# Patient Record
Sex: Female | Born: 1990
Health system: Southern US, Community
[De-identification: ages and names within clinical notes are randomized; demographics above are authoritative.]

## PROBLEM LIST (undated history)

## (undated) DIAGNOSIS — F319 Bipolar disorder, unspecified: Secondary | ICD-10-CM

## (undated) HISTORY — PX: CARDIAC SURGERY: SHX584

---

## 2011-03-10 ENCOUNTER — Emergency Department (HOSPITAL_COMMUNITY): Payer: Self-pay

## 2011-03-10 ENCOUNTER — Emergency Department (HOSPITAL_COMMUNITY)
Admission: EM | Admit: 2011-03-10 | Discharge: 2011-03-10 | Disposition: A | Discharge status: Home / Self Care | Payer: Self-pay | Attending: Emergency Medicine | Admitting: Emergency Medicine

## 2011-03-10 DIAGNOSIS — M25559 Pain in unspecified hip: Secondary | ICD-10-CM | POA: Insufficient documentation

## 2011-03-10 DIAGNOSIS — R51 Headache: Secondary | ICD-10-CM | POA: Insufficient documentation

## 2011-03-10 DIAGNOSIS — M79609 Pain in unspecified limb: Secondary | ICD-10-CM | POA: Insufficient documentation

## 2011-03-10 DIAGNOSIS — R0789 Other chest pain: Secondary | ICD-10-CM | POA: Insufficient documentation

## 2011-03-10 DIAGNOSIS — F1010 Alcohol abuse, uncomplicated: Secondary | ICD-10-CM | POA: Insufficient documentation

## 2011-03-10 DIAGNOSIS — M25569 Pain in unspecified knee: Secondary | ICD-10-CM | POA: Insufficient documentation

## 2011-03-10 DIAGNOSIS — R1032 Left lower quadrant pain: Secondary | ICD-10-CM | POA: Insufficient documentation

## 2011-03-10 DIAGNOSIS — IMO0002 Reserved for concepts with insufficient information to code with codable children: Secondary | ICD-10-CM | POA: Insufficient documentation

## 2011-03-10 LAB — POCT I-STAT, CHEM 8
Calcium, Ion: 0.99 mmol/L — ABNORMAL LOW (ref 1.12–1.32)
Chloride: 105 mEq/L (ref 96–112)
Glucose, Bld: 86 mg/dL (ref 70–99)
HCT: 46 % (ref 36.0–46.0)
Hemoglobin: 15.6 g/dL — ABNORMAL HIGH (ref 12.0–15.0)
TCO2: 23 mmol/L (ref 0–100)

## 2011-03-10 LAB — DIFFERENTIAL
Basophils Relative: 1 % (ref 0–1)
Eosinophils Absolute: 0.1 10*3/uL (ref 0.0–0.7)
Eosinophils Relative: 1 % (ref 0–5)
Lymphs Abs: 2.1 10*3/uL (ref 0.7–4.0)
Neutrophils Relative %: 63 % (ref 43–77)

## 2011-03-10 LAB — CBC
MCV: 91.2 fL (ref 78.0–100.0)
Platelets: 352 10*3/uL (ref 150–400)
RDW: 14.7 % (ref 11.5–15.5)
WBC: 8 10*3/uL (ref 4.0–10.5)

## 2011-06-08 ENCOUNTER — Emergency Department (HOSPITAL_COMMUNITY)
Admission: EM | Admit: 2011-06-08 | Discharge: 2011-06-08 | Disposition: A | Discharge status: Home / Self Care | Payer: Self-pay | Attending: Emergency Medicine | Admitting: Emergency Medicine

## 2011-06-08 DIAGNOSIS — F10929 Alcohol use, unspecified with intoxication, unspecified: Secondary | ICD-10-CM

## 2011-06-08 DIAGNOSIS — X58XXXA Exposure to other specified factors, initial encounter: Secondary | ICD-10-CM | POA: Insufficient documentation

## 2011-06-08 DIAGNOSIS — IMO0002 Reserved for concepts with insufficient information to code with codable children: Secondary | ICD-10-CM | POA: Insufficient documentation

## 2011-06-08 DIAGNOSIS — F1010 Alcohol abuse, uncomplicated: Secondary | ICD-10-CM | POA: Insufficient documentation

## 2011-06-08 LAB — URINALYSIS, ROUTINE W REFLEX MICROSCOPIC
Bilirubin Urine: NEGATIVE
Ketones, ur: NEGATIVE mg/dL
Leukocytes, UA: NEGATIVE
Nitrite: NEGATIVE
Specific Gravity, Urine: 1.005 (ref 1.005–1.030)
Urobilinogen, UA: 0.2 mg/dL (ref 0.0–1.0)

## 2011-06-08 LAB — URINE MICROSCOPIC-ADD ON

## 2011-06-08 NOTE — ED Notes (Signed)
[employer] x 2 officers outside of pt.'s room.

## 2011-06-08 NOTE — ED Provider Notes (Signed)
Medical screening examination/treatment/procedure(s) were conducted as a shared visit with non-physician practitioner(s) and myself.  I personally evaluated the patient during the encounter  Cyndra Numbers, MD 06/08/11 2246
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
## 2011-06-08 NOTE — ED Notes (Signed)
Pt taken to bathroom w/EDT to provide urine specimen.  Pt has been changed into blue paper scrubs and security has been notified to come want pt and search belongings.  [employer] standing at pt's door.  Pt is cooperative at this time.

## 2011-06-08 NOTE — ED Notes (Signed)
Pt brought in by [employer], states picked her up d/t intoxication and was going to take her to her sisters house and she urinated on herself and states "I'm going to kill myself"

## 2012-01-01 ENCOUNTER — Emergency Department (HOSPITAL_COMMUNITY)
Admission: EM | Admit: 2012-01-01 | Discharge: 2012-01-02 | Disposition: A | Discharge status: Home / Self Care | Payer: Self-pay | Attending: Emergency Medicine | Admitting: Emergency Medicine

## 2012-01-01 DIAGNOSIS — F10929 Alcohol use, unspecified with intoxication, unspecified: Secondary | ICD-10-CM

## 2012-01-01 DIAGNOSIS — R404 Transient alteration of awareness: Secondary | ICD-10-CM | POA: Insufficient documentation

## 2012-01-01 DIAGNOSIS — IMO0002 Reserved for concepts with insufficient information to code with codable children: Secondary | ICD-10-CM | POA: Insufficient documentation

## 2012-01-01 DIAGNOSIS — X58XXXA Exposure to other specified factors, initial encounter: Secondary | ICD-10-CM | POA: Insufficient documentation

## 2012-01-01 DIAGNOSIS — F1010 Alcohol abuse, uncomplicated: Secondary | ICD-10-CM | POA: Insufficient documentation

## 2012-01-01 DIAGNOSIS — R279 Unspecified lack of coordination: Secondary | ICD-10-CM | POA: Insufficient documentation

## 2012-01-01 LAB — URINALYSIS, ROUTINE W REFLEX MICROSCOPIC
Bilirubin Urine: NEGATIVE
Ketones, ur: NEGATIVE mg/dL
Nitrite: NEGATIVE
Urobilinogen, UA: 1 mg/dL (ref 0.0–1.0)

## 2012-01-01 LAB — CBC
HCT: 36.7 % (ref 36.0–46.0)
Hemoglobin: 12.4 g/dL (ref 12.0–15.0)
MCH: 31.3 pg (ref 26.0–34.0)
MCV: 92.7 fL (ref 78.0–100.0)
RBC: 3.96 MIL/uL (ref 3.87–5.11)

## 2012-01-01 MED ORDER — TETANUS-DIPHTH-ACELL PERTUSSIS 5-2.5-18.5 LF-MCG/0.5 IM SUSP
0.5000 mL | Freq: Once | INTRAMUSCULAR | Status: AC
  Administered 2012-01-01: 0.5 mL via INTRAMUSCULAR
  Filled 2012-01-01: qty 0.5

## 2012-01-01 MED ORDER — SODIUM CHLORIDE 0.9 % IV SOLN
Freq: Once | INTRAVENOUS | Status: AC
Start: 2012-01-01 — End: 2012-01-01
  Administered 2012-01-01: via INTRAVENOUS

## 2012-01-01 NOTE — ED Notes (Signed)
[id number]<AV> Expected date:<BR> Expected time:<BR> Means of arrival:<BR> Comments:<BR> EMS/intoxicated/&quot;rolled out in traffic&quot;/laceration

## 2012-01-01 NOTE — ED Notes (Signed)
Brought in by EMS from an apartment compound.  Per EMS report, pt was slumped over while sitting on the steps outside the apartment---somebody saw her and got worried what was going on with the pt and so she called 911.  Presents to ED very drowsy but arouseable---states she has been drinking "forties".

## 2012-01-02 ENCOUNTER — Encounter (HOSPITAL_COMMUNITY): Payer: Self-pay | Admitting: Emergency Medicine

## 2012-01-02 LAB — BASIC METABOLIC PANEL
BUN: 8 mg/dL (ref 6–23)
Chloride: 99 mEq/L (ref 96–112)
Glucose, Bld: 90 mg/dL (ref 70–99)
Potassium: 3.4 mEq/L — ABNORMAL LOW (ref 3.5–5.1)

## 2012-01-02 MED ORDER — BACITRACIN ZINC 500 UNIT/GM EX OINT
TOPICAL_OINTMENT | CUTANEOUS | Status: AC
  Administered 2012-01-02
  Filled 2012-01-02: qty 0.9

## 2012-01-02 MED ORDER — SODIUM CHLORIDE 0.9 % IV BOLUS (SEPSIS)
1000.0000 mL | Freq: Once | INTRAVENOUS | Status: AC
  Administered 2012-01-02: 1000 mL via INTRAVENOUS

## 2012-01-02 NOTE — ED Provider Notes (Addendum)
History     CSN: [id number]  Arrival date & time 01/01/12  2254   First MD Initiated Contact with Patient 01/01/12 2304      Chief Complaint  Patient presents with  . Alcohol Intoxication    (Consider location/radiation/quality/duration/timing/severity/associated sxs/prior treatment) HPI 21 yo female presents to the ER via EMS with reported alcohol intoxication.  Pt appears to be heavily intoxicated, when woken reports drinking 2 40 oz beers.  Pt with abrasions to elbows.  She is unsure of last tetanus.  She denies any other injuries, no fall or head injury.  Pt denies having friends or family who could come get her. History reviewed. No pertinent past medical history.  History reviewed. No pertinent past surgical history. Pt reports "hole in heart" open heart surgery as child  History reviewed. No pertinent family history.  History  Substance Use Topics  . Smoking status: Not on file  . Smokeless tobacco: Not on file  . Alcohol Use: Yes    OB History    Grav Para Term Preterm Abortions TAB SAB Ect Mult Living                  Review of Systems  Unable to perform ROS: Other    Allergies  Review of patient's allergies indicates no known allergies.  Home Medications  No current outpatient prescriptions on file.  BP 108/61  Pulse 90  Temp 98.4 F (36.9 C) (Oral)  Resp 18  SpO2 95%  Physical Exam  Nursing note and vitals reviewed. Constitutional: She appears well-developed and well-nourished.       Somnolent but arousable  HENT:  Head: Normocephalic and atraumatic.  Nose: Nose normal.  Mouth/Throat: Oropharynx is clear and moist.  Eyes: Conjunctivae and EOM are normal. Pupils are equal, round, and reactive to light.       nystagmus  Neck: Normal range of motion. Neck supple. No JVD present. No tracheal deviation present. No thyromegaly present.  Cardiovascular: Normal rate, regular rhythm, normal heart sounds and intact distal pulses.  Exam reveals no  gallop and no friction rub.   No murmur heard.      Sternotomy scar noted  Pulmonary/Chest: Effort normal and breath sounds normal. No stridor. No respiratory distress. She has no wheezes. She has no rales. She exhibits no tenderness.  Abdominal: Soft. Bowel sounds are normal. She exhibits no distension and no mass. There is no tenderness. There is no rebound and no guarding.  Musculoskeletal: Normal range of motion. She exhibits no edema and no tenderness.       Abrasions noted to bilateral elbows  Lymphadenopathy:    She has no cervical adenopathy.  Neurological: She is alert. She exhibits normal muscle tone. Coordination (mild ataxia) abnormal.       Somnolent but arousable  Skin: Skin is dry. No rash noted. No erythema. No pallor.  Psychiatric: She has a normal mood and affect. Her behavior is normal. Judgment and thought content normal.    ED Course  Procedures (including critical care time)  Labs Reviewed  BASIC METABOLIC PANEL - Abnormal; Notable for the following:    Potassium 3.4 (*)     All other components within normal limits  ETHANOL - Abnormal; Notable for the following:    Alcohol, Ethyl (B) 314 (*)     All other components within normal limits  CBC  URINALYSIS, ROUTINE W REFLEX MICROSCOPIC    Date: 01/24/2012  Rate: 97  Rhythm: normal sinus rhythm  QRS Axis:  normal  Intervals: normal  ST/T Wave abnormalities: nonspecific T wave changes  Conduction Disutrbances:none  Narrative Interpretation:   Old EKG Reviewed: none available     1. Alcohol intoxication       MDM  21 yo female with alcohol intoxication.  Will allow to sober, update tetanus        Olivia Mackie, MD 01/02/12 2137  Olivia Mackie, MD 01/24/12 [phone]

## 2012-01-02 NOTE — ED Notes (Signed)
Pt awake at this time and requested a soda drink--- states she still feels "drunk" and not ready to go home yet when informed her that it's time to go home.

## 2012-12-30 ENCOUNTER — Emergency Department (HOSPITAL_COMMUNITY): Payer: Self-pay

## 2012-12-30 ENCOUNTER — Encounter (HOSPITAL_COMMUNITY): Payer: Self-pay | Admitting: Emergency Medicine

## 2012-12-30 ENCOUNTER — Emergency Department (HOSPITAL_COMMUNITY)
Admission: EM | Admit: 2012-12-30 | Discharge: 2012-12-30 | Disposition: A | Discharge status: Home / Self Care | Payer: Self-pay | Attending: Emergency Medicine | Admitting: Emergency Medicine

## 2012-12-30 DIAGNOSIS — W19XXXA Unspecified fall, initial encounter: Secondary | ICD-10-CM

## 2012-12-30 DIAGNOSIS — Y9229 Other specified public building as the place of occurrence of the external cause: Secondary | ICD-10-CM | POA: Insufficient documentation

## 2012-12-30 DIAGNOSIS — F1010 Alcohol abuse, uncomplicated: Secondary | ICD-10-CM | POA: Insufficient documentation

## 2012-12-30 DIAGNOSIS — Z23 Encounter for immunization: Secondary | ICD-10-CM | POA: Insufficient documentation

## 2012-12-30 DIAGNOSIS — W010XXA Fall on same level from slipping, tripping and stumbling without subsequent striking against object, initial encounter: Secondary | ICD-10-CM | POA: Insufficient documentation

## 2012-12-30 DIAGNOSIS — IMO0002 Reserved for concepts with insufficient information to code with codable children: Secondary | ICD-10-CM | POA: Insufficient documentation

## 2012-12-30 DIAGNOSIS — S01511A Laceration without foreign body of lip, initial encounter: Secondary | ICD-10-CM

## 2012-12-30 DIAGNOSIS — Y9302 Activity, running: Secondary | ICD-10-CM | POA: Insufficient documentation

## 2012-12-30 DIAGNOSIS — S01501A Unspecified open wound of lip, initial encounter: Secondary | ICD-10-CM | POA: Insufficient documentation

## 2012-12-30 DIAGNOSIS — S8990XA Unspecified injury of unspecified lower leg, initial encounter: Secondary | ICD-10-CM | POA: Insufficient documentation

## 2012-12-30 MED ORDER — TETANUS-DIPHTH-ACELL PERTUSSIS 5-2.5-18.5 LF-MCG/0.5 IM SUSP
0.5000 mL | Freq: Once | INTRAMUSCULAR | Status: AC
  Administered 2012-12-30: 0.5 mL via INTRAMUSCULAR
  Filled 2012-12-30: qty 0.5

## 2012-12-30 MED ORDER — IBUPROFEN 800 MG PO TABS
800.0000 mg | ORAL_TABLET | Freq: Once | ORAL | Status: AC
  Administered 2012-12-30: 800 mg via ORAL
  Filled 2012-12-30 (×2): qty 1
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
## 2012-12-30 NOTE — ED Notes (Signed)
Pt showing  increased agitation. Pt stumbling in room,escorted to bathroom x 2. Security called to assist with load, uncooperative behavior. PA reviewed stated. Triage level changed due to increased head pain and agitation

## 2012-12-30 NOTE — ED Notes (Signed)
Pt "stumbled and fell onto face" . Bleeding noted on upper lip. Controlled at present. Pt denies alcohol use
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
## 2012-12-30 NOTE — ED Notes (Signed)
[id number]<XB> Expected date:<BR> Expected time:<BR> Means of arrival:<BR> Comments:<BR> 21 y/o lip lac

## 2012-12-30 NOTE — ED Notes (Signed)
Pt has been screaming in room. rn went in to ask pt what was wrong. Pt would not stop screaming and crying. PA came into room to talk to pt and pt would not talk to PA.

## 2012-12-30 NOTE — ED Notes (Signed)
Pt to xray at this time.

## 2012-12-30 NOTE — ED Notes (Signed)
Pt returned from xray at this time.

## 2013-01-01 NOTE — ED Provider Notes (Signed)
  Medical screening examination/treatment/procedure(s) were performed by non-physician practitioner and as supervising physician I was immediately available for consultation/collaboration.   Gerhard Munch, MD 01/01/13 [phone]

## 2013-02-11 ENCOUNTER — Encounter (HOSPITAL_COMMUNITY): Payer: Self-pay | Admitting: Physical Medicine and Rehabilitation

## 2013-02-11 ENCOUNTER — Emergency Department (HOSPITAL_COMMUNITY)
Admission: EM | Admit: 2013-02-11 | Discharge: 2013-02-11 | Disposition: A | Discharge status: Home / Self Care | Payer: Self-pay | Attending: Emergency Medicine | Admitting: Emergency Medicine

## 2013-02-11 ENCOUNTER — Encounter (HOSPITAL_COMMUNITY): Payer: Self-pay | Admitting: Emergency Medicine

## 2013-02-11 DIAGNOSIS — H10213 Acute toxic conjunctivitis, bilateral: Secondary | ICD-10-CM

## 2013-02-11 DIAGNOSIS — H5789 Other specified disorders of eye and adnexa: Secondary | ICD-10-CM

## 2013-02-11 DIAGNOSIS — F17200 Nicotine dependence, unspecified, uncomplicated: Secondary | ICD-10-CM | POA: Insufficient documentation

## 2013-02-11 DIAGNOSIS — H579 Unspecified disorder of eye and adnexa: Secondary | ICD-10-CM | POA: Insufficient documentation

## 2013-02-11 DIAGNOSIS — Z880 Allergy status to penicillin: Secondary | ICD-10-CM | POA: Insufficient documentation

## 2013-02-11 DIAGNOSIS — H10219 Acute toxic conjunctivitis, unspecified eye: Secondary | ICD-10-CM | POA: Insufficient documentation

## 2013-02-11 MED ORDER — IBUPROFEN 800 MG PO TABS
800.0000 mg | ORAL_TABLET | Freq: Once | ORAL | Status: AC
  Administered 2013-02-11: 800 mg via ORAL
  Filled 2013-02-11: qty 1

## 2013-02-11 NOTE — ED Notes (Signed)
ETOH onboard, patient reports drinking 4 cans of beer today.
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
## 2013-02-15 NOTE — ED Provider Notes (Signed)
Medical screening examination/treatment/procedure(s) were performed by non-physician practitioner and as supervising physician I was immediately available for consultation/collaboration.   Cordelro Gautreau M Chianne Byrns, DO 02/15/13 1158 

## 2014-07-28 ENCOUNTER — Encounter (HOSPITAL_COMMUNITY): Payer: Self-pay | Admitting: Emergency Medicine

## 2014-07-28 ENCOUNTER — Emergency Department (HOSPITAL_COMMUNITY)
Admission: EM | Admit: 2014-07-28 | Discharge: 2014-07-28 | Disposition: A | Discharge status: Home / Self Care | Payer: Self-pay | Attending: Emergency Medicine | Admitting: Emergency Medicine

## 2014-07-28 ENCOUNTER — Emergency Department (HOSPITAL_COMMUNITY): Payer: Self-pay

## 2014-07-28 DIAGNOSIS — F1010 Alcohol abuse, uncomplicated: Secondary | ICD-10-CM | POA: Diagnosis present

## 2014-07-28 DIAGNOSIS — F1014 Alcohol abuse with alcohol-induced mood disorder: Secondary | ICD-10-CM | POA: Insufficient documentation

## 2014-07-28 DIAGNOSIS — Z720 Tobacco use: Secondary | ICD-10-CM | POA: Insufficient documentation

## 2014-07-28 DIAGNOSIS — R45851 Suicidal ideations: Secondary | ICD-10-CM

## 2014-07-28 DIAGNOSIS — Z880 Allergy status to penicillin: Secondary | ICD-10-CM | POA: Insufficient documentation

## 2014-07-28 DIAGNOSIS — Y9289 Other specified places as the place of occurrence of the external cause: Secondary | ICD-10-CM | POA: Insufficient documentation

## 2014-07-28 DIAGNOSIS — S022XXA Fracture of nasal bones, initial encounter for closed fracture: Secondary | ICD-10-CM

## 2014-07-28 DIAGNOSIS — Y9389 Activity, other specified: Secondary | ICD-10-CM | POA: Insufficient documentation

## 2014-07-28 DIAGNOSIS — Z3202 Encounter for pregnancy test, result negative: Secondary | ICD-10-CM | POA: Insufficient documentation

## 2014-07-28 DIAGNOSIS — F1994 Other psychoactive substance use, unspecified with psychoactive substance-induced mood disorder: Secondary | ICD-10-CM | POA: Diagnosis present

## 2014-07-28 DIAGNOSIS — Y998 Other external cause status: Secondary | ICD-10-CM | POA: Insufficient documentation

## 2014-07-28 LAB — COMPREHENSIVE METABOLIC PANEL
ALK PHOS: 99 U/L (ref 39–117)
ALT: 24 U/L (ref 0–35)
ANION GAP: 5 (ref 5–15)
AST: 38 U/L — ABNORMAL HIGH (ref 0–37)
Albumin: 3.9 g/dL (ref 3.5–5.2)
BILIRUBIN TOTAL: 0.3 mg/dL (ref 0.3–1.2)
BUN: 11 mg/dL (ref 6–23)
CO2: 21 mmol/L (ref 19–32)
Calcium: 8 mg/dL — ABNORMAL LOW (ref 8.4–10.5)
Chloride: 113 mmol/L — ABNORMAL HIGH (ref 96–112)
Creatinine, Ser: 0.58 mg/dL (ref 0.50–1.10)
GLUCOSE: 95 mg/dL (ref 70–99)
POTASSIUM: 3.9 mmol/L (ref 3.5–5.1)
SODIUM: 139 mmol/L (ref 135–145)
Total Protein: 7.2 g/dL (ref 6.0–8.3)

## 2014-07-28 LAB — RAPID URINE DRUG SCREEN, HOSP PERFORMED
Amphetamines: NOT DETECTED
BARBITURATES: NOT DETECTED
Benzodiazepines: NOT DETECTED
Cocaine: NOT DETECTED
Opiates: NOT DETECTED
Tetrahydrocannabinol: NOT DETECTED

## 2014-07-28 LAB — CBC
HCT: 33.7 % — ABNORMAL LOW (ref 36.0–46.0)
HEMOGLOBIN: 10.8 g/dL — AB (ref 12.0–15.0)
MCH: 29.8 pg (ref 26.0–34.0)
MCHC: 32 g/dL (ref 30.0–36.0)
MCV: 92.8 fL (ref 78.0–100.0)
Platelets: 356 10*3/uL (ref 150–400)
RBC: 3.63 MIL/uL — AB (ref 3.87–5.11)
RDW: 13.8 % (ref 11.5–15.5)
WBC: 5.5 10*3/uL (ref 4.0–10.5)

## 2014-07-28 LAB — ETHANOL: ALCOHOL ETHYL (B): 239 mg/dL — AB (ref 0–9)

## 2014-07-28 LAB — ACETAMINOPHEN LEVEL: Acetaminophen (Tylenol), Serum: <10 ug/mL — ABNORMAL LOW (ref 10–30)

## 2014-07-28 LAB — SALICYLATE LEVEL

## 2014-07-28 LAB — POC URINE PREG, ED: Preg Test, Ur: NEGATIVE

## 2014-07-28 MED ORDER — ALUM & MAG HYDROXIDE-SIMETH 200-200-20 MG/5ML PO SUSP: 30.0000 mL | ORAL | Status: DC | PRN

## 2014-07-28 MED ORDER — LORAZEPAM 1 MG PO TABS
0.0000 mg | ORAL_TABLET | Freq: Four times a day (QID) | ORAL | Status: DC
  Administered 2014-07-28: 1 mg via ORAL
  Filled 2014-07-28: qty 2

## 2014-07-28 MED ORDER — HYDROCODONE-ACETAMINOPHEN 5-325 MG PO TABS
1.0000 | ORAL_TABLET | ORAL | Status: DC | PRN
  Administered 2014-07-28 (×2): 1 via ORAL
  Filled 2014-07-28 (×2): qty 1

## 2014-07-28 MED ORDER — LORAZEPAM 1 MG PO TABS: 0.0000 mg | ORAL_TABLET | Freq: Two times a day (BID) | ORAL | Status: DC

## 2014-07-28 MED ORDER — NICOTINE 21 MG/24HR TD PT24
21.0000 mg | MEDICATED_PATCH | Freq: Every day | TRANSDERMAL | Status: DC
  Administered 2014-07-28: 21 mg via TRANSDERMAL

## 2014-07-28 MED ORDER — IBUPROFEN 200 MG PO TABS
600.0000 mg | ORAL_TABLET | Freq: Three times a day (TID) | ORAL | Status: DC | PRN
  Administered 2014-07-28: 600 mg via ORAL
  Filled 2014-07-28: qty 3

## 2014-07-28 MED ORDER — ZOLPIDEM TARTRATE 5 MG PO TABS: 5.0000 mg | ORAL_TABLET | Freq: Every evening | ORAL | Status: DC | PRN

## 2014-07-28 MED ORDER — SALINE SPRAY 0.65 % NA SOLN
1.0000 | Freq: Once | NASAL | Status: AC
  Administered 2014-07-28: 1 via NASAL
  Filled 2014-07-28: qty 44

## 2014-07-28 MED ORDER — ONDANSETRON HCL 4 MG PO TABS
4.0000 mg | ORAL_TABLET | Freq: Three times a day (TID) | ORAL | Status: DC | PRN
  Administered 2014-07-28: 4 mg via ORAL
  Filled 2014-07-28: qty 1

## 2014-07-28 MED ORDER — VITAMIN B-1 100 MG PO TABS
100.0000 mg | ORAL_TABLET | Freq: Every day | ORAL | Status: DC
  Administered 2014-07-28: 100 mg via ORAL
  Filled 2014-07-28: qty 1

## 2014-07-28 MED ORDER — THIAMINE HCL 100 MG/ML IJ SOLN: 100.0000 mg | Freq: Every day | INTRAMUSCULAR | Status: DC

## 2014-07-28 MED ORDER — SALINE SPRAY 0.65 % NA SOLN
1.0000 | NASAL | Status: DC | PRN
  Filled 2014-07-28: qty 44
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
## 2014-07-28 NOTE — ED Notes (Signed)
TTS in evaluating patient 
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
## 2014-07-28 NOTE — BHH Suicide Risk Assessment (Signed)
Suicide Risk Assessment  Discharge Assessment   [facility]   Demographic Factors:  Adolescent or young adult and Low socioeconomic status  Total Time spent with patient: 45 minutes  Musculoskeletal: Strength & Muscle Tone: within normal limits Gait & Station: normal Patient leans: N/A  Psychiatric Specialty Exam:     Blood pressure 100/66, pulse 100, temperature 98.2 F (36.8 C), temperature source Oral, resp. rate 16, SpO2 99 %.There is no height or weight on file to calculate BMI.  General Appearance: Casual  Eye Contact::  Fair  Speech:  Normal Rate409  Volume:  Normal  Mood:  Irritable  Affect:  Congruent  Thought Process:  Coherent  Orientation:  Full (Time, Place, and Person)  Thought Content:  WDL  Suicidal Thoughts:  No  Homicidal Thoughts:  No  Memory:  Immediate;   Good Recent;   Good Remote;   Good  Judgement:  Fair  Insight:  Fair  Psychomotor Activity:  Normal  Concentration:  Good  Recall:  Good  Fund of Knowledge:Good  Language: Good  Akathisia:  No  Handed:  Right  AIMS (if indicated):     Assets:  Leisure Time Physical Health Resilience  Sleep:     Cognition: WNL  ADL's:  Intact      Has this patient used any form of tobacco in the last 30 days? (Cigarettes, Smokeless Tobacco, Cigars, and/or Pipes) Yes, A prescription for an FDA-approved tobacco cessation medication was offered at discharge and the patient refused  Mental Status Per Nursing Assessment::   On Admission:   Alcohol intoxication, suicidal ideations  Current Mental Status by Physician: NA  Loss Factors: NA  Historical Factors: NA  Risk Reduction Factors:   Sense of responsibility to family  Continued Clinical Symptoms:  Irritability  Cognitive Features That Contribute To Risk:  None    Suicide Risk:  Minimal: No identifiable suicidal ideation.  Patients presenting with no risk factors but with morbid ruminations; may be classified as  minimal risk based on the severity of the depressive symptoms  Principal Problem: Suicidal ideation Discharge Diagnoses:  Patient Active Problem List   Diagnosis Date Noted  . Alcohol abuse [F10.10] 07/28/2014    Priority: High  . Substance induced mood disorder [F19.94] 07/28/2014    Priority: High  . Suicidal ideation [R45.851] 07/28/2014    Priority: High      Plan Of Care/Follow-up recommendations:  Activity:  as tolerated Diet:  heart healthy diet  Is patient on multiple antipsychotic therapies at discharge:  No   Has Patient had three or more failed trials of antipsychotic monotherapy by history:  No  Recommended Plan for Multiple Antipsychotic Therapies: NA    LORD, JAMISON, PMH-NP 07/28/2014, 2:39 PM

## 2014-07-28 NOTE — BH Assessment (Signed)
BHH Assessment Progress Note  This writer was asked to discuss discharge options with this pt.  She reportedly is seeking a halfway house.  She is homeless and in need of a supportive environment in order to maintain sobriety.  She would [place] the [facility] area.  Kari Luna, TS reports that she has called [name], and that they have beds available, but that prospective residents need photo ID, a social security number, and proof that they are homeless.  When I discuss this with the pt she reports that she does not have a photo ID.  She then reports problems that she had not previously discussed with Thedore MinsMojeed Akintayo, MD and Nanine MeansJamison Lord, NP.  I reported these to [place].  Pt is still to be discharged with referrals to resources in the community.  Included in her discharge instructions is information about the [facility] homeless shelter, the [facility] for supportive services for the homeless, and [facility] for outpatient substance abuse treatment.  She has also been given printed information for [facility] [place] to follow up at her own initiative.  Kari Canninghomas Jeraldine Primeau, MA Triage Specialist 07/28/2014 @ 15:20
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
## 2014-07-28 NOTE — Discharge Instructions (Signed)
For your shelter needs, contact the [facility] shelter, operated by [facility]:       [facility] ([facility] by [facility])      [address]      [place], [place] [address]      [phone]  For a variety of supportive services for the homeless, contact the [facility]):       [facility]      [address]      [place], [place] [address]      [phone]  To help you maintain a sober lifestyle, a substance abuse treatment program may be beneficial to you.  Contact [facility] ([facility]) at your earliest opportunity to enroll in their program:       [facility] ([facility]      [address]      [place], [place] [address]      [phone]  You have also indicated that you are interested in information about halfway houses.  Refer to the printed information provided by Emergency Department staff about [facility] to find a halfway house that is suitable to your needs.
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
## 2014-07-30 ENCOUNTER — Emergency Department (HOSPITAL_COMMUNITY)
Admission: EM | Admit: 2014-07-30 | Discharge: 2014-07-30 | Discharge status: Against Medical Advice | Payer: Self-pay | Attending: Emergency Medicine | Admitting: Emergency Medicine

## 2014-07-30 DIAGNOSIS — F10129 Alcohol abuse with intoxication, unspecified: Secondary | ICD-10-CM | POA: Insufficient documentation

## 2014-07-30 DIAGNOSIS — Z720 Tobacco use: Secondary | ICD-10-CM | POA: Insufficient documentation

## 2014-07-30 NOTE — ED Notes (Signed)
Per EMS. [facility]. Pt was walking around upon EMS arrival. Pt had been drinking etoh. Pt also thinks she has an STD.

## 2015-05-18 ENCOUNTER — Encounter (HOSPITAL_COMMUNITY): Payer: Self-pay | Admitting: Emergency Medicine

## 2015-05-18 ENCOUNTER — Emergency Department (HOSPITAL_COMMUNITY)
Admission: EM | Admit: 2015-05-18 | Discharge: 2015-05-20 | Disposition: A | Discharge status: Home / Self Care | Payer: Self-pay | Attending: Emergency Medicine | Admitting: Emergency Medicine

## 2015-05-18 DIAGNOSIS — Z880 Allergy status to penicillin: Secondary | ICD-10-CM | POA: Insufficient documentation

## 2015-05-18 DIAGNOSIS — F17210 Nicotine dependence, cigarettes, uncomplicated: Secondary | ICD-10-CM | POA: Insufficient documentation

## 2015-05-18 DIAGNOSIS — F3130 Bipolar disorder, current episode depressed, mild or moderate severity, unspecified: Secondary | ICD-10-CM | POA: Insufficient documentation

## 2015-05-18 DIAGNOSIS — F1410 Cocaine abuse, uncomplicated: Secondary | ICD-10-CM | POA: Insufficient documentation

## 2015-05-18 DIAGNOSIS — Z3202 Encounter for pregnancy test, result negative: Secondary | ICD-10-CM | POA: Insufficient documentation

## 2015-05-18 HISTORY — DX: Bipolar disorder, unspecified: F31.9
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
## 2015-05-19 DIAGNOSIS — F1410 Cocaine abuse, uncomplicated: Secondary | ICD-10-CM | POA: Diagnosis present

## 2015-05-19 DIAGNOSIS — F1020 Alcohol dependence, uncomplicated: Secondary | ICD-10-CM | POA: Insufficient documentation

## 2015-05-19 LAB — RAPID URINE DRUG SCREEN, HOSP PERFORMED
Amphetamines: NOT DETECTED
Barbiturates: NOT DETECTED
Benzodiazepines: NOT DETECTED
Cocaine: POSITIVE — AB
Opiates: NOT DETECTED
TETRAHYDROCANNABINOL: NOT DETECTED

## 2015-05-19 LAB — ETHANOL: Alcohol, Ethyl (B): 88 mg/dL — ABNORMAL HIGH (ref <5)

## 2015-05-19 LAB — CBC WITH DIFFERENTIAL/PLATELET
BASOS ABS: 0 10*3/uL (ref 0.0–0.1)
Basophils Relative: 1 %
EOS PCT: 4 %
Eosinophils Absolute: 0.1 10*3/uL (ref 0.0–0.7)
HCT: 35.6 % — ABNORMAL LOW (ref 36.0–46.0)
Hemoglobin: 11.5 g/dL — ABNORMAL LOW (ref 12.0–15.0)
LYMPHS PCT: 31 %
Lymphs Abs: 1.1 10*3/uL (ref 0.7–4.0)
MCH: 31.6 pg (ref 26.0–34.0)
MCHC: 32.3 g/dL (ref 30.0–36.0)
MCV: 97.8 fL (ref 78.0–100.0)
Monocytes Absolute: 0.4 10*3/uL (ref 0.1–1.0)
Monocytes Relative: 11 %
NEUTROS PCT: 53 %
Neutro Abs: 1.9 10*3/uL (ref 1.7–7.7)
PLATELETS: 327 10*3/uL (ref 150–400)
RBC: 3.64 MIL/uL — AB (ref 3.87–5.11)
RDW: 15.5 % (ref 11.5–15.5)
WBC: 3.6 10*3/uL — AB (ref 4.0–10.5)

## 2015-05-19 LAB — COMPREHENSIVE METABOLIC PANEL
ALT: 105 U/L — AB (ref 14–54)
AST: 144 U/L — ABNORMAL HIGH (ref 15–41)
Albumin: 3.9 g/dL (ref 3.5–5.0)
Alkaline Phosphatase: 102 U/L (ref 38–126)
Anion gap: 13 (ref 5–15)
BUN: 9 mg/dL (ref 6–20)
CHLORIDE: 106 mmol/L (ref 101–111)
CO2: 24 mmol/L (ref 22–32)
CREATININE: 0.78 mg/dL (ref 0.44–1.00)
Calcium: 9 mg/dL (ref 8.9–10.3)
GFR calc Af Amer: >60 mL/min (ref >60)
Glucose, Bld: 115 mg/dL — ABNORMAL HIGH (ref 65–99)
Potassium: 4.4 mmol/L (ref 3.5–5.1)
SODIUM: 143 mmol/L (ref 135–145)
Total Bilirubin: 0.4 mg/dL (ref 0.3–1.2)
Total Protein: 7.1 g/dL (ref 6.5–8.1)

## 2015-05-19 LAB — PREGNANCY, URINE: PREG TEST UR: NEGATIVE

## 2015-05-19 MED ORDER — HYDROXYZINE HCL 25 MG PO TABS: 25.0000 mg | ORAL_TABLET | Freq: Three times a day (TID) | ORAL | Status: DC | PRN

## 2015-05-19 MED ORDER — ONDANSETRON HCL 4 MG PO TABS
4.0000 mg | ORAL_TABLET | Freq: Three times a day (TID) | ORAL | Status: DC | PRN
  Administered 2015-05-19: 4 mg via ORAL
  Filled 2015-05-19: qty 1

## 2015-05-19 MED ORDER — METRONIDAZOLE 500 MG PO TABS
2000.0000 mg | ORAL_TABLET | Freq: Once | ORAL | Status: AC
  Administered 2015-05-19: 2000 mg via ORAL

## 2015-05-19 MED ORDER — TRAZODONE HCL 50 MG PO TABS: 50.0000 mg | ORAL_TABLET | Freq: Every day | ORAL | Status: DC

## 2015-05-19 MED ORDER — ULIPRISTAL ACETATE 30 MG PO TABS
30.0000 mg | ORAL_TABLET | Freq: Once | ORAL | Status: AC
  Administered 2015-05-19: 30 mg via ORAL
  Filled 2015-05-19: qty 1

## 2015-05-19 MED ORDER — AZITHROMYCIN 1 G PO PACK
1.0000 g | PACK | Freq: Once | ORAL | Status: AC
  Administered 2015-05-19: 1 g via ORAL
  Filled 2015-05-19: qty 1

## 2015-05-19 MED ORDER — CEFIXIME 400 MG PO TABS
400.0000 mg | ORAL_TABLET | Freq: Once | ORAL | Status: AC
  Administered 2015-05-19: 400 mg via ORAL
  Filled 2015-05-19: qty 1

## 2015-05-19 NOTE — BH Assessment (Signed)
[facility] Assessment Progress Note  The following facilities have been contacted to seek placement for this pt, with results as noted:  Beds available, information sent, decision pending[facility]   No beds available, but accepting referrals for future consideration; information sent:  [facility]   At [facility]:  [facility]    Munira Polson, [place] Triage Specialist [phone]

## 2015-05-19 NOTE — ED Notes (Signed)
As per NP  [place], pt refusing admission, will be discharged in am.  Pt agreeable to disposition.

## 2015-05-19 NOTE — ED Notes (Signed)
[facility] refused [facility].
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
## 2015-05-19 NOTE — BH Assessment (Signed)
Contacted [facility] charge nurse for cart for tele-psych. Afterwards, consulted with Karleen HampshireSpencer, PA who states that does meet inpatient criteria 300 hall recommended, placement pending, elevated BAC levels. Lanard Arguijo K. Jesus GeneraHarris,  LPC-A, [facility]  Counselor 05/19/2015 4:47 AM

## 2015-05-19 NOTE — ED Notes (Signed)
Pt refused lab work - took awhile to wake up and seemed pretty out of it.  Did verbally say no when asked if we could do blood work.  RN aware.

## 2015-05-19 NOTE — ED Notes (Signed)
telepsych computer at bedside.  

## 2015-05-19 NOTE — ED Notes (Signed)
Pt states she does not want to be admitted, she wants to go home.  TTS [place] notified.

## 2015-05-19 NOTE — ED Notes (Signed)
[facility]  Was informed patient refused all blood work.  She also refused to see the SANE nurse.  Patient is not forthcoming with information at this time.  She appears tired and lethargic.  She states that she is having thoughts of hurting herself.  Safety checks continues q15 minutes.  Patient will be seen by TTS and treatment team this morning.

## 2015-05-19 NOTE — ED Notes (Signed)
Pt AAO x 3, no distress noted, resting at present.  Monitoring for safety, Q 15 min checks in effect.

## 2015-05-19 NOTE — ED Notes (Signed)
Patient came to nursing stations requesting discharge.  Explained to patient that MD and NP had discussed getting a bed at another facility.  Patient states, "I'm fine now.  I don't need that.  I just want to leave."  Informed NP, Julieanne CottonJosephine who will talk to patient.

## 2015-05-19 NOTE — ED Notes (Signed)
Pt has 2 belonging bags. In pt bags she has two black jackets, a pair of nike shoes, a black t-shirt, a pair of green socks and a pair of blue jeans. Pt was wand by security.
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
## 2015-05-19 NOTE — ED Notes (Signed)
PA Antony MaduraKelly Humes aware pt refused blood work.

## 2015-05-19 NOTE — BH Assessment (Signed)
[facility] Sts that patient was accepted to their facility for inpatient treatment. Nursing report # is [phone]. The accepting provider is Dr. Guss [place]. Patient is voluntary and [facility] will need to provide transport to the facility.

## 2015-05-19 NOTE — ED Notes (Signed)
Per [name] PA-C's note Pt refused SANE workup and refused the medications in the SANE KIT.  Pt is alert but slow to respond at this time.  Pt going to Psych ED and RN in Psych unit aware of this.
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
## 2015-05-20 DIAGNOSIS — F315 Bipolar disorder, current episode depressed, severe, with psychotic features: Secondary | ICD-10-CM
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
## 2015-05-20 NOTE — Progress Notes (Signed)
[facility] she was given uninsured resources  Pt did state she was active with [facility]  CM spoke with pt who [place]cp.  CM discussed and provided written information for uninsured accepting pcps, discussed the importance of pcp vs EDP services for f/u care, www.needymeds.org, www.goodrx.com, discounted pharmacies and other [place] resources such as [facility] , [facility], affordable care act, financial assistance, uninsured dental services, [place] med assist, DSS and  health department  Reviewed resources for [place] uninsured accepting pcps like [facility], family medicine at [address], [facility], [facility], local urgent care centers, [facility], [facility] family [facility], general medical clinics, [facility] services of the [place], [facility] urgent care plus others, medication resources, [facility] out patient pharmacies and housing Pt voiced understanding and appreciation of resources provided   Provided [facility] contact information

## 2015-05-20 NOTE — ED Notes (Signed)
Patient discharged home per MD order.  Patient's discharge instructions reviewed and follow up is with [facility].  Patient received all her personal belongings. She denies SI/HI/AVH. She plans to keep her follow up appointment and continue with medication management.

## 2015-05-20 NOTE — BH Assessment (Signed)
Patient was reassessed by TTS.   Patient states that she is "ready to go" and asked "where is the doctor, I need to leave I got stuff to do." Patient states that she is on probation and [facility] that is a condition of her probation and she missed her appointment due to being in the ED. Patient states that she has "no issues, I'm fine" and requested to see the psychiatrist.  Patient denies SI/HI and AVH and contracts for safety. Patient states that she would like to follow-up with [facility].   Informed psychiatrist of patients request.

## 2015-05-20 NOTE — Discharge Instructions (Signed)
For your ongoing mental health needs, you are advised to follow up with [facility].  If you do not currently have an appointment, new and returning patients are seen at their walk-in clinic.  Walk-in hours are Monday - Friday from 8:00 am - 3:00 pm.  Walk-in patients are seen on a first come, first served basis.  Try to arrive as early as possible for he best chance of being seen the same day: ° °     [facility] °     [address] °     [place], [place] [address] °     [phone] °

## 2015-05-20 NOTE — BH Assessment (Signed)
[facility] Assessment Progress Note   Per Thedore MinsMojeed Akintayo, MD, this pt does not require psychiatric hospitalization at this time.  [facility].  Discharge instructions include contact information for [facility].  Pt's nurse has been notified.    Doylene Canninghomas Milicent Acheampong, MA  Triage Specialist  [phone]

## 2015-05-20 NOTE — [facility] Suicide Risk Assessment (Cosign Needed)
    [facility]   Demographic Factors:  Adolescent or young adult and Low socioeconomic status  Total Time spent with patient: 15 minutes  Musculoskeletal: Strength & Muscle Tone: within normal limits Gait & Station: normal Patient leans: N/A  Psychiatric Specialty Exam:     Blood pressure 100/66, pulse 82, temperature 97.5 F (36.4 C), temperature source Oral, resp. rate 18, last menstrual period 05/11/2015, SpO2 100 %.There is no height or weight on file to calculate BMI.  General Appearance: Casual and Fairly Groomed  Patent attorneyye Contact:: Good  Speech: Clear and Coherent and Normal Rate  Volume: Normal  Mood: Anxious  Affect: Appropriate and Congruent  Thought Process: Coherent, Goal Directed and Intact  Orientation: Full (Time, Place, and Person)  Thought Content: WDL  Suicidal Thoughts: No  Homicidal Thoughts: No  Memory: Immediate; Good Recent; Good Remote; Good  Judgement: Fair  Insight: Fair  Psychomotor Activity: Psychomotor Retardation  Concentration: Good  Recall: NA  Fund of Knowledge:Fair  Language: Good  Akathisia: NA  Handed: Right  AIMS (if indicated):    Assets: Desire for Improvement Financial Resources/Insurance Housing  ADL's: Intact  Cognition: WNL  Sleep:               Has this patient used any form of tobacco in the last 30 days? (Cigarettes, Smokeless Tobacco, Cigars, and/or Pipes) No  Mental Status Per Nursing Assessment::   On Admission:     Current Mental Status by Physician: Pt seen and chart reviewed with NP/MD team on 05/20/2015. Pt seen and chart reviewed. Pt is alert/oriented x4, calm, cooperative, and appropriate to situation. Pt denies suicidal/homicidal ideation and psychosis and does not appear to be responding to internal stimuli. Pt reports that [facility] and that she feels she no longer needs to be here.   Loss  Factors: Financial problems/change in socioeconomic status  Historical Factors: Family history of mental illness or substance abuse and Impulsivity  Risk Reduction Factors:   Sense of responsibility to family, Positive social support and Positive therapeutic relationship  Continued Clinical Symptoms:  Depression:   Impulsivity Insomnia  Cognitive Features That Contribute To Risk:  Polarized thinking    Suicide Risk:  Minimal: No identifiable suicidal ideation.  Patients presenting with no risk factors but with morbid ruminations; may be classified as minimal risk based on the severity of the depressive symptoms  Principal Problem: Bipolar affective disorder, depressed, severe, with psychotic behavior [facility](HCC) Discharge Diagnoses:  Patient Active Problem List   Diagnosis Date Noted  . Bipolar affective disorder, depressed, severe, with psychotic behavior (HCC) [F31.5] 05/19/2015  . Cocaine abuse [F14.10] 05/19/2015  . Alcohol use disorder, moderate, dependence (HCC) [F10.20]   . Alcohol abuse [F10.10] 07/28/2014  . Substance induced mood disorder (HCC) [F19.94] 07/28/2014  . Suicidal ideation [R45.851] 07/28/2014      Plan Of Care/Follow-up recommendations:  Activity:  As tolerated Diet:  Heart healthy with low sodium.  Is patient on multiple antipsychotic therapies at discharge:  No   Has Patient had three or more failed trials of antipsychotic monotherapy by history:  No  Recommended Plan for Multiple Antipsychotic Therapies: NA   Jaime Dome C, FNP-BC 05/20/2015, 11:12 AM

## 2016-04-30 ENCOUNTER — Encounter (HOSPITAL_COMMUNITY): Payer: Self-pay | Admitting: Emergency Medicine

## 2016-04-30 ENCOUNTER — Emergency Department (HOSPITAL_COMMUNITY): Payer: Self-pay

## 2016-04-30 ENCOUNTER — Emergency Department (HOSPITAL_COMMUNITY)
Admission: EM | Admit: 2016-04-30 | Discharge: 2016-04-30 | Disposition: A | Discharge status: Home / Self Care | Payer: Self-pay | Attending: Emergency Medicine | Admitting: Emergency Medicine

## 2016-04-30 DIAGNOSIS — Z79899 Other long term (current) drug therapy: Secondary | ICD-10-CM | POA: Insufficient documentation

## 2016-04-30 DIAGNOSIS — F17210 Nicotine dependence, cigarettes, uncomplicated: Secondary | ICD-10-CM | POA: Insufficient documentation

## 2016-04-30 DIAGNOSIS — S0033XA Contusion of nose, initial encounter: Secondary | ICD-10-CM | POA: Insufficient documentation

## 2016-04-30 DIAGNOSIS — M25561 Pain in right knee: Secondary | ICD-10-CM | POA: Insufficient documentation

## 2016-04-30 DIAGNOSIS — R51 Headache: Secondary | ICD-10-CM | POA: Insufficient documentation

## 2016-04-30 DIAGNOSIS — Y939 Activity, unspecified: Secondary | ICD-10-CM | POA: Insufficient documentation

## 2016-04-30 DIAGNOSIS — Y999 Unspecified external cause status: Secondary | ICD-10-CM | POA: Insufficient documentation

## 2016-04-30 DIAGNOSIS — Y92524 Gas station as the place of occurrence of the external cause: Secondary | ICD-10-CM | POA: Insufficient documentation

## 2016-04-30 MED ORDER — OXYMETAZOLINE HCL 0.05 % NA SOLN
1.0000 | Freq: Once | NASAL | Status: DC
  Filled 2016-04-30: qty 15

## 2016-04-30 MED ORDER — NAPROXEN 500 MG PO TABS: 500.0000 mg | ORAL_TABLET | Freq: Two times a day (BID) | ORAL | 0 refills | Status: DC

## 2016-04-30 MED ORDER — TETANUS-DIPHTH-ACELL PERTUSSIS 5-2.5-18.5 LF-MCG/0.5 IM SUSP
0.5000 mL | Freq: Once | INTRAMUSCULAR | Status: AC
  Administered 2016-04-30: 0.5 mL via INTRAMUSCULAR
  Filled 2016-04-30: qty 0.5
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
## 2016-04-30 NOTE — Discharge Instructions (Signed)
Ice areas of pain to limit swelling. You may take naproxen as prescribed for pain control. Use Afrin provided to you if you develop any nosebleeds. For repeat nosebleeds, apply constant pressure for at least 5 minutes to stop bleeding. Follow-up with your primary care doctor for a recheck of symptoms in one week.

## 2016-04-30 NOTE — ED Triage Notes (Signed)
Pt now sitting up and [facility]. PA at bedside.

## 2016-04-30 NOTE — ED Notes (Addendum)
After receiving tetnus shot pt began to curse at staff and refused to blow nose in order to administer afrin nasal spray. Officer at bedside. Currently awaiting test results and disposition. Handcuffs still in place behind back.
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
## 2016-08-19 ENCOUNTER — Emergency Department (HOSPITAL_COMMUNITY): Payer: Self-pay

## 2016-08-19 ENCOUNTER — Encounter (HOSPITAL_COMMUNITY): Payer: Self-pay | Admitting: Emergency Medicine

## 2016-08-19 ENCOUNTER — Emergency Department (HOSPITAL_COMMUNITY)
Admission: EM | Admit: 2016-08-19 | Discharge: 2016-08-19 | Disposition: A | Discharge status: Home / Self Care | Payer: Self-pay | Attending: Emergency Medicine | Admitting: Emergency Medicine

## 2016-08-19 DIAGNOSIS — Y939 Activity, unspecified: Secondary | ICD-10-CM | POA: Insufficient documentation

## 2016-08-19 DIAGNOSIS — Y929 Unspecified place or not applicable: Secondary | ICD-10-CM | POA: Insufficient documentation

## 2016-08-19 DIAGNOSIS — Y999 Unspecified external cause status: Secondary | ICD-10-CM | POA: Insufficient documentation

## 2016-08-19 DIAGNOSIS — S0083XA Contusion of other part of head, initial encounter: Secondary | ICD-10-CM | POA: Insufficient documentation

## 2016-08-19 DIAGNOSIS — F17210 Nicotine dependence, cigarettes, uncomplicated: Secondary | ICD-10-CM | POA: Insufficient documentation

## 2016-08-19 DIAGNOSIS — Z79899 Other long term (current) drug therapy: Secondary | ICD-10-CM | POA: Insufficient documentation

## 2016-08-19 MED ORDER — IBUPROFEN 800 MG PO TABS
800.0000 mg | ORAL_TABLET | Freq: Once | ORAL | Status: AC
  Administered 2016-08-19: 800 mg via ORAL
  Filled 2016-08-19: qty 1

## 2016-08-19 MED ORDER — IBUPROFEN 800 MG PO TABS: 800.0000 mg | ORAL_TABLET | Freq: Three times a day (TID) | ORAL | 0 refills | Status: DC
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
## 2017-11-23 ENCOUNTER — Inpatient Hospital Stay (HOSPITAL_COMMUNITY)
Admission: AD | Admit: 2017-11-23 | Discharge: 2017-11-26 | DRG: 885 | Disposition: A | Discharge status: Home / Self Care | Payer: Federal, State, Local not specified - Other | Attending: Psychiatry | Admitting: Psychiatry

## 2017-11-23 ENCOUNTER — Other Ambulatory Visit: Payer: Self-pay

## 2017-11-23 ENCOUNTER — Encounter (HOSPITAL_COMMUNITY): Payer: Self-pay

## 2017-11-23 ENCOUNTER — Encounter (HOSPITAL_COMMUNITY): Payer: Self-pay | Admitting: Emergency Medicine

## 2017-11-23 ENCOUNTER — Emergency Department (HOSPITAL_COMMUNITY)
Admission: EM | Admit: 2017-11-23 | Discharge: 2017-11-23 | Disposition: A | Discharge status: Other Acute Inpatient Hospital | Payer: Self-pay | Attending: Emergency Medicine | Admitting: Emergency Medicine

## 2017-11-23 DIAGNOSIS — F315 Bipolar disorder, current episode depressed, severe, with psychotic features: Principal | ICD-10-CM | POA: Diagnosis present

## 2017-11-23 DIAGNOSIS — Z79899 Other long term (current) drug therapy: Secondary | ICD-10-CM

## 2017-11-23 DIAGNOSIS — F419 Anxiety disorder, unspecified: Secondary | ICD-10-CM | POA: Diagnosis present

## 2017-11-23 DIAGNOSIS — T43022A Poisoning by tetracyclic antidepressants, intentional self-harm, initial encounter: Secondary | ICD-10-CM | POA: Diagnosis present

## 2017-11-23 DIAGNOSIS — F314 Bipolar disorder, current episode depressed, severe, without psychotic features: Secondary | ICD-10-CM | POA: Diagnosis present

## 2017-11-23 DIAGNOSIS — R45851 Suicidal ideations: Secondary | ICD-10-CM | POA: Insufficient documentation

## 2017-11-23 DIAGNOSIS — F1010 Alcohol abuse, uncomplicated: Secondary | ICD-10-CM | POA: Diagnosis present

## 2017-11-23 DIAGNOSIS — T43021A Poisoning by tetracyclic antidepressants, accidental (unintentional), initial encounter: Secondary | ICD-10-CM | POA: Diagnosis present

## 2017-11-23 DIAGNOSIS — Z880 Allergy status to penicillin: Secondary | ICD-10-CM | POA: Diagnosis not present

## 2017-11-23 DIAGNOSIS — F332 Major depressive disorder, recurrent severe without psychotic features: Secondary | ICD-10-CM | POA: Diagnosis not present

## 2017-11-23 DIAGNOSIS — F1020 Alcohol dependence, uncomplicated: Secondary | ICD-10-CM | POA: Diagnosis present

## 2017-11-23 DIAGNOSIS — F10239 Alcohol dependence with withdrawal, unspecified: Secondary | ICD-10-CM | POA: Diagnosis present

## 2017-11-23 DIAGNOSIS — Y907 Blood alcohol level of 200-239 mg/100 ml: Secondary | ICD-10-CM | POA: Diagnosis present

## 2017-11-23 DIAGNOSIS — F17210 Nicotine dependence, cigarettes, uncomplicated: Secondary | ICD-10-CM | POA: Diagnosis present

## 2017-11-23 LAB — COMPREHENSIVE METABOLIC PANEL
ALK PHOS: 100 U/L (ref 38–126)
ALT: 18 U/L (ref 14–54)
AST: 27 U/L (ref 15–41)
Albumin: 4.5 g/dL (ref 3.5–5.0)
Anion gap: 8 (ref 5–15)
BILIRUBIN TOTAL: 0.6 mg/dL (ref 0.3–1.2)
BUN: 7 mg/dL (ref 6–20)
CO2: 27 mmol/L (ref 22–32)
CREATININE: 0.8 mg/dL (ref 0.44–1.00)
Calcium: 8.7 mg/dL — ABNORMAL LOW (ref 8.9–10.3)
Chloride: 106 mmol/L (ref 101–111)
GFR calc Af Amer: >60 mL/min (ref >60)
Glucose, Bld: 86 mg/dL (ref 65–99)
Potassium: 3.9 mmol/L (ref 3.5–5.1)
Sodium: 141 mmol/L (ref 135–145)
TOTAL PROTEIN: 8.2 g/dL — AB (ref 6.5–8.1)

## 2017-11-23 LAB — CBC
HEMATOCRIT: 39.1 % (ref 36.0–46.0)
HEMOGLOBIN: 12.9 g/dL (ref 12.0–15.0)
MCH: 29.5 pg (ref 26.0–34.0)
MCHC: 33 g/dL (ref 30.0–36.0)
MCV: 89.3 fL (ref 78.0–100.0)
Platelets: 478 10*3/uL — ABNORMAL HIGH (ref 150–400)
RBC: 4.38 MIL/uL (ref 3.87–5.11)
RDW: 14.5 % (ref 11.5–15.5)
WBC: 6.3 10*3/uL (ref 4.0–10.5)

## 2017-11-23 LAB — I-STAT BETA HCG BLOOD, ED (MC, WL, AP ONLY): I-stat hCG, quantitative: <5 m[IU]/mL (ref <5)

## 2017-11-23 LAB — SALICYLATE LEVEL: Salicylate Lvl: <7 mg/dL (ref 2.8–30.0)

## 2017-11-23 LAB — ACETAMINOPHEN LEVEL: Acetaminophen (Tylenol), Serum: <10 ug/mL — ABNORMAL LOW (ref 10–30)

## 2017-11-23 LAB — ETHANOL: ALCOHOL ETHYL (B): 204 mg/dL — AB (ref <10)

## 2017-11-23 MED ORDER — ALUM & MAG HYDROXIDE-SIMETH 200-200-20 MG/5ML PO SUSP: 30.0000 mL | ORAL | Status: DC | PRN

## 2017-11-23 MED ORDER — NICOTINE 21 MG/24HR TD PT24
21.0000 mg | MEDICATED_PATCH | Freq: Every day | TRANSDERMAL | Status: DC
  Administered 2017-11-23: 21 mg via TRANSDERMAL
  Filled 2017-11-23: qty 1

## 2017-11-23 MED ORDER — ACETAMINOPHEN 325 MG PO TABS: 650.0000 mg | ORAL_TABLET | Freq: Four times a day (QID) | ORAL | Status: DC | PRN

## 2017-11-23 MED ORDER — ALBUTEROL SULFATE (2.5 MG/3ML) 0.083% IN NEBU: 5.0000 mg | INHALATION_SOLUTION | Freq: Once | RESPIRATORY_TRACT | Status: DC

## 2017-11-23 MED ORDER — ZIPRASIDONE MESYLATE 20 MG IM SOLR: 20.0000 mg | INTRAMUSCULAR | Status: DC | PRN

## 2017-11-23 MED ORDER — NICOTINE 21 MG/24HR TD PT24
21.0000 mg | MEDICATED_PATCH | Freq: Every day | TRANSDERMAL | Status: DC
  Administered 2017-11-24: 21 mg via TRANSDERMAL
  Filled 2017-11-23 (×5): qty 1

## 2017-11-23 MED ORDER — MAGNESIUM HYDROXIDE 400 MG/5ML PO SUSP: 30.0000 mL | Freq: Every day | ORAL | Status: DC | PRN

## 2017-11-23 MED ORDER — OLANZAPINE 10 MG PO TBDP: 10.0000 mg | ORAL_TABLET | Freq: Three times a day (TID) | ORAL | Status: DC | PRN

## 2017-11-23 MED ORDER — ACETAMINOPHEN 325 MG PO TABS: 650.0000 mg | ORAL_TABLET | ORAL | Status: DC | PRN

## 2017-11-23 MED ORDER — OLANZAPINE 10 MG PO TBDP
10.0000 mg | ORAL_TABLET | Freq: Three times a day (TID) | ORAL | Status: DC | PRN
  Administered 2017-11-24: 10 mg via ORAL
  Filled 2017-11-23: qty 1

## 2017-11-23 MED ORDER — LORAZEPAM 1 MG PO TABS: 1.0000 mg | ORAL_TABLET | ORAL | Status: DC | PRN

## 2017-11-23 MED ORDER — ONDANSETRON HCL 4 MG PO TABS: 4.0000 mg | ORAL_TABLET | Freq: Three times a day (TID) | ORAL | Status: DC | PRN

## 2017-11-23 MED ORDER — ENSURE ENLIVE PO LIQD
237.0000 mL | Freq: Two times a day (BID) | ORAL | Status: DC
  Administered 2017-11-24 – 2017-11-25 (×2): 237 mL via ORAL

## 2017-11-23 MED ORDER — ACETAMINOPHEN 325 MG PO TABS
650.0000 mg | ORAL_TABLET | ORAL | Status: DC | PRN
Start: 2017-11-23 — End: 2017-11-23

## 2017-11-23 NOTE — Tx Team (Signed)
Initial Treatment Plan 11/23/2017 6:18 PM Kari Luna MVH:[id number]    PATIENT STRESSORS: Legal issue Loss of grandmother and aunt Occupational concerns   PATIENT STRENGTHS: Average or above average intelligence Capable of independent living Communication skills Physical Health   PATIENT IDENTIFIED PROBLEMS:    Financial issues    "I would like to work on being positive and staying focused"    " lost my grandmother and aunt while I was in jail"           DISCHARGE CRITERIA:  Adequate post-discharge living arrangements Improved stabilization in mood, thinking, and/or behavior Motivation to continue treatment in a less acute level of care Verbal commitment to aftercare and medication compliance  PRELIMINARY DISCHARGE PLAN: Outpatient therapy Placement in alternative living arrangements  PATIENT/FAMILY INVOLVEMENT: This treatment plan has been presented to and reviewed with the patient, Kari Luna.  The patient has been given the opportunity to ask questions and make suggestions.  Shela NevinValerie S Tecumseh Yeagley, RN 11/23/2017, 6:18 PM

## 2017-11-23 NOTE — ED Notes (Signed)
Report given to [facility]  

## 2017-11-23 NOTE — ED Provider Notes (Signed)
27 yo F with a chief complaint of suicidal ideation.  Received the patient in signout this morning.  Was notified by the psychiatry service that the patient has not had her first impression filled out.  Patient is continued to be suicidal.  She has been accepted in an inpatient setting.   Melene PlanFloyd, Maleko Greulich, DO 11/23/17 1510
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
## 2017-11-23 NOTE — Progress Notes (Signed)
Adult Psychoeducational Group Note  Date:  11/23/2017 Time:  9:33 PM  Group Topic/Focus:  Wrap-Up Group:   The focus of this group is to help patients review their daily goal of treatment and discuss progress on daily workbooks.  Participation Level:  Active  Participation Quality:  Appropriate  Affect:  Appropriate  Cognitive:  Appropriate  Insight: Appropriate  Engagement in Group:  Engaged  Modes of Intervention:  Discussion  Additional Comments:  The paitent expressed that she rates today a 5.The patient also said that she attended group.  Octavio Mannshigpen, Gerre Ranum Lee 11/23/2017, 9:33 PM

## 2017-11-23 NOTE — ED Provider Notes (Signed)
[facility]-EMERGENCY DEPT Provider Note   CSN: [id number] Arrival date & time: 11/23/17  0455     History   Chief Complaint Chief Complaint  Patient presents with  . Suicidal    HPI Kari Luna is a 27 y.o. female.  Patient presents to the ER for evaluation of suicidal ideation.  Patient reports a long history of depression and is currently suicidal at arrival to the ER.  She apparently called 911 earlier tonight and told the 911 operator that she was going to kill herself by smashing herself in the head with a rock.  When police responded to her, she began to attempt to overdose on her Remeron.  Arrival to the ER patient reports that she is still suicidal.  She states that she does not want to talk to anybody about her depression because she does not feel that she can be helped.     Past Medical History:  Diagnosis Date  . Bipolar disorder [facility](HCC)     Patient Active Problem List   Diagnosis Date Noted  . Bipolar affective disorder, depressed, severe, with psychotic behavior (HCC) 05/19/2015  . Cocaine abuse (HCC) 05/19/2015  . Alcohol use disorder, moderate, dependence (HCC)   . Alcohol abuse 07/28/2014  . Substance induced mood disorder (HCC) 07/28/2014  . Suicidal ideation 07/28/2014    Past Surgical History:  Procedure Laterality Date  . CARDIAC SURGERY       OB History   None      Home Medications    Prior to Admission medications   Medication Sig Start Date End Date Taking? Authorizing Provider  ibuprofen (ADVIL,MOTRIN) 800 MG tablet Take 1 tablet (800 mg total) by mouth 3 (three) times daily. 08/19/16   Elson AreasSofia, [name] K, PA-C  naproxen (NAPROSYN) 500 MG tablet Take 1 tablet (500 mg total) by mouth 2 (two) times daily. Take for mild/moderate pain as needed. 04/30/16   Antony MaduraHumes, Kelly, PA-C    Family History History reviewed. No pertinent family history.  Social History Social History   Tobacco Use  . Smoking status: Current Every  Day Smoker    Packs/day: 1.00    Types: Cigarettes  . Smokeless tobacco: Never Used  Substance Use Topics  . Alcohol use: Yes    Alcohol/week: 6.0 oz    Types: 10 Cans of beer per week    Comment: occ  . Drug use: No     Allergies   Penicillins   Review of Systems Review of Systems  Psychiatric/Behavioral: Positive for dysphoric mood and suicidal ideas.  All other systems reviewed and are negative.    Physical Exam Updated Vital Signs BP 108/74   Pulse 86   Temp 97.9 F (36.6 C) (Oral)   Resp 18   SpO2 98%   Physical Exam  Constitutional: She is oriented to person, place, and time. She appears well-developed and well-nourished. No distress.  HENT:  Head: Normocephalic and atraumatic.  Right Ear: Hearing normal.  Left Ear: Hearing normal.  Nose: Nose normal.  Mouth/Throat: Oropharynx is clear and moist and mucous membranes are normal.  Eyes: Pupils are equal, round, and reactive to light. Conjunctivae and EOM are normal.  Neck: Normal range of motion. Neck supple.  Cardiovascular: Regular rhythm, S1 normal and S2 normal. Exam reveals no gallop and no friction rub.  No murmur heard. Pulmonary/Chest: Effort normal and breath sounds normal. No respiratory distress. She exhibits no tenderness.  Abdominal: Soft. Normal appearance and bowel sounds are normal. There is  no hepatosplenomegaly. There is no tenderness. There is no rebound, no guarding, no tenderness at McBurney's point and negative Murphy's sign. No hernia.  Musculoskeletal: Normal range of motion.  Neurological: She is alert and oriented to person, place, and time. She has normal strength. No cranial nerve deficit or sensory deficit. Coordination normal. GCS eye subscore is 4. GCS verbal subscore is 5. GCS motor subscore is 6.  Skin: Skin is warm, dry and intact. No rash noted. No cyanosis.  Psychiatric: Her speech is normal. She is slowed. She exhibits a depressed mood. She expresses suicidal ideation.    Nursing note and vitals reviewed.    ED Treatments / Results  Labs (all labs ordered are listed, but only abnormal results are displayed) Labs Reviewed  CBC  COMPREHENSIVE METABOLIC PANEL  ETHANOL  RAPID URINE DRUG SCREEN, HOSP PERFORMED  ACETAMINOPHEN LEVEL  SALICYLATE LEVEL  I-STAT BETA HCG BLOOD, ED ([facility], [facility], [facility] ONLY)    EKG None  Radiology No results found.  Procedures Procedures (including critical care time)  Medications Ordered in ED Medications - No data to display   Initial Impression / Assessment and Plan / ED Course  I have reviewed the triage vital signs and the nursing notes.  Pertinent labs & imaging results that were available during my care of the patient were reviewed by me and considered in my medical decision making (see chart for details).     Patient presents for depression and suicidal ideation.  She did take 1 extra Remeron tablet tonight before she was stopped by police from taking anymore.  This will not need to be addressed as an overdose.  Patient medically clear for psychiatric evaluation.  Final Clinical Impressions(s) / ED Diagnoses   Final diagnoses:  Suicidal ideation    ED Discharge Orders    None       Kaymon Denomme, Canary Brim, MD 11/23/17 [phone]

## 2017-11-23 NOTE — ED Triage Notes (Signed)
Pt brought in by EMS from home with c/o SI.  Pt took 60 mg of Remeron in front of [employer] with "intent to kill self"---- pt takes Remeron 30 mg daily.   Pt stated, "If you let me walk out of here, I am going to definitely kill myself".  Pt has no definite suicidal plan.
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
## 2017-11-23 NOTE — ED Notes (Signed)
Patient came in by EMS and laid on the ED floor and would not talk to anyone. [employer] went to magistrate to get patient IVC'd.

## 2017-11-23 NOTE — BH Assessment (Signed)
[facility] Assessment Progress Note Case was staffed with Shaune PollackLord DNP who recommended patient be admitted inpatient after being medically cleared.

## 2017-11-23 NOTE — Progress Notes (Signed)
Patient is a 27 year old female [facility]. Patient under IVC due to making suicidal threats over a 911 call and proceeded to attempt to OD in front of police officers. Pt has a past med hx of Bipolar disorder and substance abuse. Pt's BAL upon arrival to ED was 204. Pt reports drinking a beer every other day. Pt denies drug use- states, "I've been clean since getting out of jail". Pt wearing an ankle monitor- reports that she has a court date on 12/03/17. Pt reports that she was previously staying [facility] [place] [facility] but currently does not have anywhere to go. Pt reports that her main stressors are financial. Pt was polite,calm and cooperative during admission interview-Pt answered questions appropriately- although pressured at times. Pt currently denies SI/HI and A/V hallucinations. Admission paperwork completed and signed. Verbal understanding expressed. Belongings searched and secured in locker. Skin assessment revealed no abnormalities nor contraband. Patient oriented to unit. Q 15 min checks initiated for safety.

## 2017-11-24 ENCOUNTER — Encounter (HOSPITAL_COMMUNITY): Payer: Self-pay | Admitting: Registered Nurse

## 2017-11-24 DIAGNOSIS — F332 Major depressive disorder, recurrent severe without psychotic features: Secondary | ICD-10-CM

## 2017-11-24 DIAGNOSIS — T43022A Poisoning by tetracyclic antidepressants, intentional self-harm, initial encounter: Secondary | ICD-10-CM

## 2017-11-24 LAB — PREGNANCY, URINE: PREG TEST UR: NEGATIVE

## 2017-11-24 MED ORDER — VITAMIN B-1 100 MG PO TABS
100.0000 mg | ORAL_TABLET | Freq: Every day | ORAL | Status: DC
  Administered 2017-11-26: 100 mg via ORAL
  Filled 2017-11-24 (×4): qty 1

## 2017-11-24 MED ORDER — LOPERAMIDE HCL 2 MG PO CAPS: 2.0000 mg | ORAL_CAPSULE | ORAL | Status: DC | PRN

## 2017-11-24 MED ORDER — HYDROXYZINE HCL 25 MG PO TABS
25.0000 mg | ORAL_TABLET | Freq: Four times a day (QID) | ORAL | Status: DC | PRN
  Administered 2017-11-24 – 2017-11-25 (×2): 25 mg via ORAL
  Filled 2017-11-24: qty 1
  Filled 2017-11-24: qty 10
  Filled 2017-11-24: qty 1

## 2017-11-24 MED ORDER — ONDANSETRON 4 MG PO TBDP: 4.0000 mg | ORAL_TABLET | Freq: Four times a day (QID) | ORAL | Status: DC | PRN

## 2017-11-24 MED ORDER — CHLORDIAZEPOXIDE HCL 25 MG PO CAPS: 25.0000 mg | ORAL_CAPSULE | Freq: Four times a day (QID) | ORAL | Status: DC | PRN

## 2017-11-24 MED ORDER — ADULT MULTIVITAMIN W/MINERALS CH
1.0000 | ORAL_TABLET | Freq: Every day | ORAL | Status: DC
  Administered 2017-11-24 – 2017-11-26 (×2): 1 via ORAL
  Filled 2017-11-24 (×6): qty 1

## 2017-11-24 NOTE — Plan of Care (Signed)
Problem: Activity: Goal: Interest or engagement in activities will improve Intervention: Patient has been provided a daily scheduled and is encouraged by staff to attend groups. Outcome: Patient is visible on the unit, interactive with peers/staff and attending groups. 11/24/2017 11:38 AM - Progressing by Ferrel Loganollazo, Corwyn Vora A, RN   Problem: Safety: Goal: Periods of time without injury will increase Intervention: Patient contracts for safety on the unit. Low fall risk precautions in place. Safety monitored with q15 minute checks. Outcome: Patient remains safe on the unit at this time. 11/24/2017 11:38 AM - Progressing by Ferrel Loganollazo, Emmily Pellegrin A, RN

## 2017-11-24 NOTE — Plan of Care (Signed)
Pt continues to progress towards goals and d/c. RN will continue to monitor.  
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
## 2017-11-24 NOTE — BHH Group Notes (Signed)
Adult Psychoeducational Group Note  Date:  11/24/2017 Time:  10:00 AM  Group Topic/Focus: Love Language Healthy Communication:   The focus of this group is to discuss communication, barriers to communication, as well as healthy ways to communicate with others.  Participation Level:  Active  Participation Quality:  Appropriate and Attentive  Affect:  Appropriate  Cognitive:  Alert and Oriented  Insight: Improving  Engagement in Group:  Developing/Improving  Modes of Intervention:  Discussion and Education  Additional Comments:  Patient reports that her love language is quality time and identified her mom as her support system. Patient was supportive of peers and demonstrated fair insight.  Marchelle Folksmanda A Longino Trefz 11/24/2017, 10:30 AM

## 2017-11-24 NOTE — BHH Group Notes (Signed)
Adult Psychoeducational Group Note  Date:  11/24/2017 Time:  9:30 AM  Group Topic/Focus: Orientation/Goals Group Orientation:   The focus of this group is to educate the patient on the purpose and policies of crisis stabilization and provide a format to answer questions about their admission.  The group details unit policies and expectations of patients while admitted. Goals Group:   The focus of this group is to help patients establish daily goals to achieve during treatment and discuss how the patient can incorporate goal setting into their daily lives to aide in recovery.   Participation Level:  Active  Participation Quality:  Appropriate and Attentive  Affect:  Appropriate  Cognitive:  Alert and Oriented  Insight: Improving  Engagement in Group:  Developing/Improving  Modes of Intervention:  Discussion and Education  Additional Comments:  Patient reports her goal for today is to start looking for a job.  Marchelle Folksmanda A Vir Whetstine 11/24/2017, 10:00 AM

## 2017-11-24 NOTE — Progress Notes (Signed)
Patient ID: Kari AlleyKendra Luna, female   DOB: Mar 14, 1991, 27 y.o.   MRN: [id number]  Urine specimen collected and placed in refridgerator for lab pick-up.

## 2017-11-24 NOTE — H&P (Signed)
Psychiatric Admission Assessment Adult  Patient Identification: Kari Luna MRN:  [id number] Date of Evaluation:  11/24/2017 Chief Complaint:  Bipolar Disorder Principal Diagnosis: Major depressive disorder, recurrent severe without psychotic features ([place]) Diagnosis:   Patient Active Problem List   Diagnosis Date Noted  . Major depressive disorder, recurrent severe without psychotic features ([place]) [F33.2] 11/23/2017  . Bipolar affective disorder, depressed, severe, with psychotic behavior ([place]) [F31.5] 05/19/2015  . Cocaine abuse ([place]) [F14.10] 05/19/2015  . Alcohol use disorder, moderate, dependence ([place]) [F10.20]   . Alcohol abuse [F10.10] 07/28/2014  . Substance induced mood disorder ([place]) [F19.94] 07/28/2014  . Suicidal ideation [R45.851] 07/28/2014   History of Present Illness: Kari Luna, 27 y.o., female patient admitted to [facility] for psychiatric treatment and stabilization after she had presented to [facility]  With complaints of suicidal ideation and taking 8 Remeron in an suicide attempt.  Patient seen face to face by this provider; chart reviewed and discussed with Dr. Parke Poisson and treatment team on 11/24/17.  On evaluation Kari Luna reports that she was recently let out of jail 10/03/17 and has been living in [facility] in a motel related to being on probation and having an ankle monitor.  States that she also has to go to classes ([employer]" on Tuesday and Thursday.  At this time patient states.  "I ran out of money for my motel but the guy that works there said that I could sleep in the laundry room.  When I was in the laundry room sleep the police came and told me that I couldn't sleep there.  I thought I had a friend in [place] that I could stay with just for a day or two and the police brought me all the way to [place].  When I got to my friends house about 2 or 3 o'clock in the morning; he came to the door looked out; then turned around but didn't open the door.  At that time I was  feeling some type of way like there was nobody there for me; like nobody cared about me.  That's when I took about 8 of the Remeron.  I was thinking about taking the rest of them but I called 911 and I was brought to the hospital.  It was just situational.  I don't feel like that any more and I had been drinking."  Patient states that she does not drink daily only occasionally and denies any drug use.  States that her family is in [place], [place] and she and her parole officer are in the process of getting everything set up for her there so she can go home; states she will be able to live with her mother.  At this time patient denies suicidal/self-harm/homicidal ideation, psychosis, and paranoia.    Associated Signs/Symptoms: Depression Symptoms:  difficulty concentrating, suicidal attempt, anxiety, (Hypo) Manic Symptoms:  Denies Anxiety Symptoms:  Excessive Worry, Psychotic Symptoms:  Denies PTSD Symptoms: denies Total Time spent with patient: 45 minutes  Past Psychiatric History: Patient reports at the age of 27 her father was taking to see a psychiatrist and she was diagnosed with Bipolar and started on medication but doesn't remember what it was.  States while she was in prison see was taking Remeron and "another medicine but I don't remember the name.  The doctor took me off because I gained to much weight.  I think it was Prozac."  Patient denies prior history of inpatient psychiatric treatment; prior suicide attempt, self harm, or history  of violence.  Is the patient at risk to self? Yes.    Has the patient been a risk to self in the past 6 months? No.  Has the patient been a risk to self within the distant past? No.  Is the patient a risk to others? No.  Has the patient been a risk to others in the past 6 months? No.  Has the patient been a risk to others within the distant past? No.   Prior Inpatient Therapy:   Prior Outpatient Therapy:    Alcohol Screening:   Substance Abuse History  in the last 12 months:  No.  Patient denies; no UDS has been collected.  One ordered Consequences of Substance Abuse:  NA Previous Psychotropic Medications: Yes Currently taking Remeron Psychological Evaluations: No  Past Medical History:  Past Medical History:  Diagnosis Date  . Bipolar disorder [facility])     Past Surgical History:  Procedure Laterality Date  . CARDIAC SURGERY     Family History: History reviewed. No pertinent family history. Family Psychiatric  History: Denies  Tobacco Screening:   Social History:  Social History   Substance and Sexual Activity  Alcohol Use Yes  . Alcohol/week: 6.0 oz  . Types: 10 Cans of beer per week   Comment: occ     Social History   Substance and Sexual Activity  Drug Use No    Additional Social History:                           Allergies:   Allergies  Allergen Reactions  . Penicillins Nausea And Vomiting    Has patient had a PCN reaction causing immediate rash, facial/tongue/throat swelling, SOB or lightheadedness with hypotension: No Has patient had a PCN reaction causing severe rash involving mucus membranes or skin necrosis: No Has patient had a PCN reaction that required hospitalization No Has patient had a PCN reaction occurring within the last 10 years: No If all of the above answers are "NO", then may proceed with Cephalosporin use.     Lab Results:  Results for orders placed or performed during the hospital encounter of 11/23/17 (from the past 48 hour(s))  CBC     Status: Abnormal   Collection Time: 11/23/17  5:18 AM  Result Value Ref Range   WBC 6.3 4.0 - 10.5 K/uL   RBC 4.38 3.87 - 5.11 MIL/uL   Hemoglobin 12.9 12.0 - 15.0 g/dL   HCT 39.1 36.0 - 46.0 %   MCV 89.3 78.0 - 100.0 fL   MCH 29.5 26.0 - 34.0 pg   MCHC 33.0 30.0 - 36.0 g/dL   RDW 14.5 11.5 - 15.5 %   Platelets 478 (H) 150 - 400 K/uL    Comment: Performed at [facility], [place] [address] [place],  [address]   Comprehensive metabolic panel     Status: Abnormal   Collection Time: 11/23/17  5:18 AM  Result Value Ref Range   Sodium 141 135 - 145 mmol/L   Potassium 3.9 3.5 - 5.1 mmol/L   Chloride 106 101 - 111 mmol/L   CO2 27 22 - 32 mmol/L   Glucose, Bld 86 65 - 99 mg/dL   BUN 7 6 - 20 mg/dL   Creatinine, Ser 0.80 0.44 - 1.00 mg/dL   Calcium 8.7 (L) 8.9 - 10.3 mg/dL   Total Protein 8.2 (H) 6.5 - 8.1 g/dL   Albumin 4.5 3.5 - 5.0 g/dL  AST 27 15 - 41 U/L   ALT 18 14 - 54 U/L   Alkaline Phosphatase 100 38 - 126 U/L   Total Bilirubin 0.6 0.3 - 1.2 mg/dL   GFR calc non Af Amer >60 >60 mL/min   GFR calc Af Amer >60 >60 mL/min    Comment: (NOTE) The eGFR has been calculated using the CKD EPI equation. This calculation has not been validated in all clinical situations. eGFR's persistently <60 mL/min signify possible Chronic Kidney Disease.    Anion gap 8 5 - 15    Comment: Performed at [facility], [place] [address], [place], [place] [address]  Ethanol     Status: Abnormal   Collection Time: 11/23/17  5:18 AM  Result Value Ref Range   Alcohol, Ethyl (B) 204 (H) <10 mg/dL    Comment: (NOTE) Lowest detectable limit for serum alcohol is 10 mg/dL. For medical purposes only. Performed at [facility], [place] [address], [place], [place] [address]   Acetaminophen level     Status: Abnormal   Collection Time: 11/23/17  5:18 AM  Result Value Ref Range   Acetaminophen (Tylenol), Serum <10 (L) 10 - 30 ug/mL    Comment: (NOTE) Therapeutic concentrations vary significantly. A range of 10-30 ug/mL  may be an effective concentration for many patients. However, some  are best treated at concentrations outside of this range. Acetaminophen concentrations >150 ug/mL at 4 hours after ingestion  and >50 ug/mL at 12 hours after ingestion are often associated with  toxic reactions. Performed at [facility], [place] [address] [place], [place] [address]    Salicylate level     Status: None   Collection Time: 11/23/17  5:18 AM  Result Value Ref Range   Salicylate Lvl <5.0 2.8 - 30.0 mg/dL    Comment: Performed at [facility], [place] [address], [place], [place] [address]  I-Stat beta hCG blood, ED ([facility], [facility], [facility] only)     Status: None   Collection Time: 11/23/17  5:50 AM  Result Value Ref Range   I-stat hCG, quantitative <5.0 <5 mIU/mL   Comment 3            Comment:   GEST. AGE      CONC.  (mIU/mL)   <=1 WEEK        5 - 50     2 WEEKS       50 - 500     3 WEEKS       100 - 10,000     4 WEEKS     1,000 - 30,000        FEMALE AND NON-PREGNANT FEMALE:     LESS THAN 5 mIU/mL     Blood Alcohol level:  Lab Results  Component Value Date   ETH 204 (H) 11/23/2017   ETH 88 (H) 82/99/3716    Metabolic Disorder Labs:  No results found for: HGBA1C, MPG No results found for: PROLACTIN No results found for: CHOL, TRIG, HDL, CHOLHDL, VLDL, LDLCALC  Current Medications: Current Facility-Administered Medications  Medication Dose Route Frequency Provider Last Rate Last Dose  . acetaminophen (TYLENOL) tablet 650 mg  650 mg Oral Q4H PRN Patrecia Pour, NP      . alum & mag hydroxide-simeth (MAALOX/MYLANTA) 200-200-20 MG/5ML suspension 30 mL  30 mL Oral Q4H PRN Patrecia Pour, NP      . feeding supplement (ENSURE ENLIVE) (ENSURE ENLIVE) liquid 237 mL  237  mL Oral BID BM Patrecia Pour, NP      . OLANZapine zydis (ZYPREXA) disintegrating tablet 10 mg  10 mg Oral Q8H PRN Patrecia Pour, NP       And  . LORazepam (ATIVAN) tablet 1 mg  1 mg Oral PRN Patrecia Pour, NP       And  . ziprasidone (GEODON) injection 20 mg  20 mg Intramuscular PRN Patrecia Pour, NP      . magnesium hydroxide (MILK OF MAGNESIA) suspension 30 mL  30 mL Oral Daily PRN Patrecia Pour, NP      . nicotine (NICODERM CQ - dosed in mg/24 hours) patch 21 mg  21 mg Transdermal Daily Patrecia Pour, NP   21 mg at 11/24/17 0749  . ondansetron (ZOFRAN) tablet 4  mg  4 mg Oral Q8H PRN Patrecia Pour, NP       PTA Medications: Medications Prior to Admission  Medication Sig Dispense Refill Last Dose  . ibuprofen (ADVIL,MOTRIN) 800 MG tablet Take 1 tablet (800 mg total) by mouth 3 (three) times daily. (Patient not taking: Reported on 11/23/2017) 21 tablet 0 Not Taking at Unknown time  . naproxen (NAPROSYN) 500 MG tablet Take 1 tablet (500 mg total) by mouth 2 (two) times daily. Take for mild/moderate pain as needed. (Patient not taking: Reported on 11/23/2017) 30 tablet 0 Not Taking at Unknown time    Musculoskeletal: Strength & Muscle Tone: within normal limits Gait & Station: normal Patient leans: N/A  Psychiatric Specialty Exam: Physical Exam  Constitutional: She is oriented to person, place, and time. She appears well-developed and well-nourished.  HENT:  Head: Normocephalic.  Neck: Normal range of motion. Neck supple.  Respiratory: Effort normal.  Musculoskeletal: Normal range of motion.  Neurological: She is alert and oriented to person, place, and time.  Skin: Skin is warm and dry.  Psychiatric: She has a normal mood and affect. Her speech is normal and behavior is normal. Thought content normal. Cognition and memory are normal. She expresses impulsivity.    Review of Systems  Gastrointestinal: Negative for abdominal pain, blood in stool, constipation, diarrhea, heartburn, nausea and vomiting.  Psychiatric/Behavioral: Depression: Reports on and off. Hallucinations: Denies.  But states that she was diagnosed at 27 yr old with Bipolar disorder. Memory loss: Denies. Substance abuse: Denies. Suicidal ideas: States that it was an impulsive act "the other day"  Denies at this time. Nervous/anxious: Denies  Insomnia: Denies      Blood pressure 112/86, pulse 91, temperature 98.4 F (36.9 C), temperature source Oral, resp. rate 18, height '5\' 4"'$  (1.626 m), weight 64 kg (141 lb), SpO2 100 %.Body mass index is 24.2 kg/m.  General Appearance: Casual   Eye Contact:  Good  Speech:  Blocked and Normal Rate  Volume:  Normal  Mood:  Appropriate  Affect:  Congruent and Depressed  Thought Process:  Coherent and Goal Directed  Orientation:  Full (Time, Place, and Person)  Thought Content:  Logical  Suicidal Thoughts:  Denies at this time; but took attempted overdose on Remeron prior to admission  Homicidal Thoughts:  No  Memory:  Immediate;   Good Recent;   Good Remote;   Fair  Judgement:  Fair  Insight:  Present  Psychomotor Activity:  Normal  Concentration:  Concentration: Fair and Attention Span: Fair  Recall:  Good  Fund of Knowledge:  Fair  Language:  Good  Akathisia:  No  Handed:  Right  AIMS (if indicated):  Assets:  Communication Skills Desire for Improvement Social Support  ADL's:  Intact  Cognition:  WNL  Sleep:  Number of Hours: 6.75    Treatment Plan Summary: Daily contact with patient to assess and evaluate symptoms and progress in treatment and Medication management  Observation Level/Precautions:  15 minute checks  Laboratory:  CBC Chemistry Profile Ordered pregnancy and UDS  Psychotherapy:  Individual and group sessions  Medications:  Start Abilify 2 mg daily for major depression and mood stabilization  Consultations:  As appropriate  Discharge Concerns:  Safety, stabilization, and access to medication  Estimated LOS: 7 days  Other:     Physician Treatment Plan for Primary Diagnosis: Major depressive disorder, recurrent severe without psychotic features ([place]) Long Term Goal(s): Improvement in symptoms so as ready for discharge  Short Term Goals: Ability to identify changes in lifestyle to reduce recurrence of condition will improve, Ability to verbalize feelings will improve, Ability to disclose and discuss suicidal ideas, Ability to demonstrate self-control will improve and Ability to identify and develop effective coping behaviors will improve  Physician Treatment Plan for Secondary Diagnosis:  Principal Problem:   Major depressive disorder, recurrent severe without psychotic features ([place])  Long Term Goal(s): Improvement in symptoms so as ready for discharge  Short Term Goals: Ability to demonstrate self-control will improve, Ability to identify and develop effective coping behaviors will improve, Ability to maintain clinical measurements within normal limits will improve, Compliance with prescribed medications will improve and Ability to identify triggers associated with substance abuse/mental health issues will improve  I certify that inpatient services furnished can reasonably be expected to improve the patient's condition.    Suliman Termini, NP 6/16/201910:40 AM
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
## 2017-11-24 NOTE — Progress Notes (Signed)
Patient ID: Kari Luna, female   DOB: 02-28-1991, 27 y.o.   MRN: [id number] D: Pt observed in the in the dayroom interacting with peers. Pt at assessment denied anxiety, depression, pain, SI/HI or AVH; "I felt suicidal yesterday because I had nowhere to go and I had no money but I feel better now."  A: Pt was given the opportunity to asked questions. Support, encouragement, and safe environment provided. 15-minute safety checks continue. R: Pt was med compliant. All patient's questions and concerns addressed. Pt did not attend wrap-up group. Safety checks continue.

## 2017-11-24 NOTE — Progress Notes (Addendum)
[facility] Group Notes:  (Nursing/MHT/Case Management/Adjunct)  Date:  11/24/2017  Time:  3:22 PM  Type of Therapy:  Psychoeducational Skills  Participation Level:  Did Not Attend  Summary of Progress/Problems: Patient did not attend afternoon psychoeducation group on healthy boundary setting. Patient was with Child psychotherapistsocial worker during time of group.  Kirstie MirzaJonathan C Anant Agard 11/24/2017, 3:22 PM

## 2017-11-24 NOTE — [facility] Group Notes (Signed)
[facility]  Date/Time:  11/24/2017  11:00AM-12:00PM  Type of Therapy and Topic:  Group Therapy:  Music and Mood  Participation Level:  Active   Description of Group: In this process group, members listened to a variety of genres of music and identified that different types of music evoke different responses.  Patients were encouraged to identify music that was soothing for them and music that was energizing for them.  Patients discussed how this knowledge can help with wellness and recovery in various ways including managing depression and anxiety as well as encouraging healthy sleep habits.    Therapeutic Goals: 1. Patients will explore the impact of different varieties of music on mood 2. Patients will verbalize the thoughts they have when listening to different types of music 3. Patients will identify music that is soothing to them as well as music that is energizing to them 4. Patients will discuss how to use this knowledge to assist in maintaining wellness and recovery 5. Patients will explore the use of music as a coping skill  Summary of Patient Progress:  At the beginning of group, patient expressed that she felt hyper, rating it a 10 out of 10.  She participated well, was not fidgety in her seat, only got up to leave the room 2 times for a couple of specific reasons, and did return.  She was able to tell how each song made her feel, and much of that was "hopeful."  At the end of group she said she still felt hyper, at the same level of 9.  Therapeutic Modalities: Solution Focused Brief Therapy Activity   Ambrose MantleMareida Grossman-Orr, LCSW
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
## 2017-11-25 DIAGNOSIS — F314 Bipolar disorder, current episode depressed, severe, without psychotic features: Secondary | ICD-10-CM

## 2017-11-25 LAB — RAPID URINE DRUG SCREEN, HOSP PERFORMED
AMPHETAMINES: NOT DETECTED
BENZODIAZEPINES: NOT DETECTED
Cocaine: NOT DETECTED
OPIATES: NOT DETECTED
Tetrahydrocannabinol: NOT DETECTED

## 2017-11-25 MED ORDER — ARIPIPRAZOLE 10 MG PO TABS
10.0000 mg | ORAL_TABLET | Freq: Every day | ORAL | Status: DC
  Administered 2017-11-25 – 2017-11-26 (×2): 10 mg via ORAL
  Filled 2017-11-25 (×3): qty 1
  Filled 2017-11-25: qty 7
  Filled 2017-11-25: qty 1

## 2017-11-25 NOTE — [facility] Group Notes (Signed)
LCSW Group Therapy Note   11/25/2017 1:15pm   Type of Therapy and Topic:  Group Therapy:  Overcoming Obstacles   Participation Level:  Minimal   Description of Group:    In this group patients will be encouraged to explore what they see as obstacles to their own wellness and recovery. They will be guided to discuss their thoughts, feelings, and behaviors related to these obstacles. The group will process together ways to cope with barriers, with attention given to specific choices patients can make. Each patient will be challenged to identify changes they are motivated to make in order to overcome their obstacles. This group will be process-oriented, with patients participating in exploration of their own experiences as well as giving and receiving support and challenge from other group members.   Therapeutic Goals: 1. Patient will identify personal and current obstacles as they relate to admission. 2. Patient will identify barriers that currently interfere with their wellness or overcoming obstacles.  3. Patient will identify feelings, thought process and behaviors related to these barriers. 4. Patient will identify two changes they are willing to make to overcome these obstacles:      Summary of Patient Progress  Decided to come only after I told her Dr bases decision re: d/c on patient participation in groups.  Reluctant to share information, minimizing any problems.  "Other people is where my stress comes from. They come around when they know I have money."  Admitted she has no job now, and no money of her own, but insisted that negative people will still find her, and cause problems.  Focuses solely on getting out of here tomorrow.   Therapeutic Modalities:   Cognitive Behavioral Therapy Solution Focused Therapy Motivational Interviewing Relapse Prevention Therapy  Ida RogueRodney B Gianna Calef, LCSW 11/25/2017 3:03 PM

## 2017-11-25 NOTE — Progress Notes (Signed)
[facility]  11/25/2017 12:56 PM Kari Luna  MRN:  [id number] Subjective:    Kari Luna is a 27 y/o F with history of bipolar I (and elsewhere MDD), who was admitted from [facility] on IVC placed in ED after taking intentional overdose of remeron and expressing worsening symptoms of depression. Pt was medically stabilized and then [facility] and [facility]. She was started on PRN of librium for possible alcohol withdrawal. She agreed to sign in voluntarily. She reported incremental improvement of her presenting symptoms.  Today upon evaluation, pt relates story of events prior to her admission. She shares that she had run of out of money to stay in a hotel, and police offered to drop her off at a friend's home to stay overnight, but then that friend refused to answer the door. Pt shares that she felt overwhelmed and frustrated at her situation so she took and overdose. She is future-oriented today about following up with her TASK class which she is taking as part of her probation. She denies SI/HI/AH/VH. Discussed with patient about importance of taking medication for mood stabilization and preventing suicide attempts in the future. Pt is in agreement for trial of abilify today. She would like to discharge as soon as possible to attend her TASK class, and we discussed how we could provide her with a note, if needed, to excuse her absence. Pt was in agreement with the above plan, and she had no further questions, comments, or concerns.  Principal Problem: Bipolar I disorder, most recent episode depressed, severe without psychotic features (HCC) Diagnosis:   Patient Active Problem List   Diagnosis Date Noted  . Bipolar I disorder, most recent episode depressed, severe without psychotic features (HCC) [F31.4] 11/23/2017  . Cocaine abuse (HCC) [F14.10] 05/19/2015  . Alcohol use disorder, moderate, dependence (HCC) [F10.20]   . Alcohol abuse [F10.10] 07/28/2014   . Substance induced mood disorder (HCC) [F19.94] 07/28/2014  . Suicidal ideation [R45.851] 07/28/2014   Total Time spent with patient: 30 minutes  Past Psychiatric History: see H&P  Past Medical History:  Past Medical History:  Diagnosis Date  . Bipolar disorder [facility](HCC)     Past Surgical History:  Procedure Laterality Date  . CARDIAC SURGERY     Family History: History reviewed. No pertinent family history. Family Psychiatric  History: see H&P Social History:  Social History   Substance and Sexual Activity  Alcohol Use Yes  . Alcohol/week: 6.0 oz  . Types: 10 Cans of beer per week   Comment: occ     Social History   Substance and Sexual Activity  Drug Use No    Social History   Socioeconomic History  . Marital status: Single    Spouse name: Not on file  . Number of children: Not on file  . Years of education: Not on file  . Highest education level: Not on file  Occupational History  . Not on file  Social Needs  . Financial resource strain: Not on file  . Food insecurity:    Worry: Not on file    Inability: Not on file  . Transportation needs:    Medical: Not on file    Non-medical: Not on file  Tobacco Use  . Smoking status: Current Every Day Smoker    Packs/day: 1.00    Types: Cigarettes  . Smokeless tobacco: Never Used  Substance and Sexual Activity  . Alcohol use: Yes    Alcohol/week: 6.0 oz  Types: 10 Cans of beer per week    Comment: occ  . Drug use: No  . Sexual activity: Never  Lifestyle  . Physical activity:    Days per week: Not on file    Minutes per session: Not on file  . Stress: Not on file  Relationships  . Social connections:    Talks on phone: Not on file    Gets together: Not on file    Attends religious service: Not on file    Active member of club or organization: Not on file    Attends meetings of clubs or organizations: Not on file    Relationship status: Not on file  Other Topics Concern  . Not on file  Social  History Narrative  . Not on file   Additional Social History:                         Sleep: Good  Appetite:  Good  Current Medications: Current Facility-Administered Medications  Medication Dose Route Frequency Provider Last Rate Last Dose  . acetaminophen (TYLENOL) tablet 650 mg  650 mg Oral Q4H PRN Charm Rings, NP      . ARIPiprazole (ABILIFY) tablet 10 mg  10 mg Oral Daily Micheal Likens, MD      . chlordiazePOXIDE (LIBRIUM) capsule 25 mg  25 mg Oral Q6H PRN Cobos, Fernando A, MD      . feeding supplement (ENSURE ENLIVE) (ENSURE ENLIVE) liquid 237 mL  237 mL Oral BID BM Charm Rings, NP   237 mL at 11/24/17 1630  . hydrOXYzine (ATARAX/VISTARIL) tablet 25 mg  25 mg Oral Q6H PRN Cobos, Rockey Situ, MD   25 mg at 11/24/17 2101  . OLANZapine zydis (ZYPREXA) disintegrating tablet 10 mg  10 mg Oral Q8H PRN Charm Rings, NP   10 mg at 11/24/17 2101   And  . LORazepam (ATIVAN) tablet 1 mg  1 mg Oral PRN Charm Rings, NP       And  . ziprasidone (GEODON) injection 20 mg  20 mg Intramuscular PRN Charm Rings, NP      . multivitamin with minerals tablet 1 tablet  1 tablet Oral Daily Cobos, Rockey Situ, MD   1 tablet at 11/24/17 1809  . nicotine (NICODERM CQ - dosed in mg/24 hours) patch 21 mg  21 mg Transdermal Daily Charm Rings, NP   21 mg at 11/24/17 0749  . thiamine (VITAMIN B-1) tablet 100 mg  100 mg Oral Daily Cobos, Rockey Situ, MD        Lab Results:  Results for orders placed or performed during the hospital encounter of 11/23/17 (from the past 48 hour(s))  Urine rapid drug screen (hosp performed)     Status: Abnormal   Collection Time: 11/24/17  9:43 AM  Result Value Ref Range   Opiates NONE DETECTED NONE DETECTED   Cocaine NONE DETECTED NONE DETECTED   Benzodiazepines NONE DETECTED NONE DETECTED   Amphetamines NONE DETECTED NONE DETECTED   Tetrahydrocannabinol NONE DETECTED NONE DETECTED   Barbiturates (A) NONE DETECTED    Result not  available. Reagent lot number recalled by manufacturer.    Comment: Performed at [facility], [address], [place], [place] [address]  Pregnancy, urine     Status: None   Collection Time: 11/24/17  9:44 AM  Result Value Ref Range   Preg Test, Ur NEGATIVE NEGATIVE    Comment:  THE SENSITIVITY OF THIS METHODOLOGY IS >20 mIU/mL. Performed at [facility], [address] [place], [place] [address]     Blood Alcohol level:  Lab Results  Component Value Date   ETH 204 (H) 11/23/2017   ETH 88 (H) 05/19/2015    Metabolic Disorder Labs: No results found for: HGBA1C, MPG No results found for: PROLACTIN No results found for: CHOL, TRIG, HDL, CHOLHDL, VLDL, LDLCALC  Physical Findings: AIMS: Facial and Oral Movements Muscles of Facial Expression: None, normal Lips and Perioral Area: None, normal Jaw: None, normal Tongue: None, normal,Extremity Movements Upper (arms, wrists, hands, fingers): None, normal Lower (legs, knees, ankles, toes): None, normal, Trunk Movements Neck, shoulders, hips: None, normal, Overall Severity Severity of abnormal movements (highest score from questions above): None, normal Incapacitation due to abnormal movements: None, normal Patient's awareness of abnormal movements (rate only patient's report): No Awareness, Dental Status Current problems with teeth and/or dentures?: No Does patient usually wear dentures?: No  CIWA:  CIWA-Ar Total: 0 COWS:     Musculoskeletal: Strength & Muscle Tone: within normal limits Gait & Station: normal Patient leans: N/A  Psychiatric Specialty Exam: Physical Exam  Nursing note and vitals reviewed.   Review of Systems  Constitutional: Negative for chills and fever.  Respiratory: Negative for cough and shortness of breath.   Cardiovascular: Negative for chest pain.  Gastrointestinal: Negative for abdominal pain, heartburn, nausea and vomiting.  Psychiatric/Behavioral:  Negative for depression, hallucinations and suicidal ideas. The patient is not nervous/anxious and does not have insomnia.     Blood pressure 134/74, pulse (!) 118, temperature 98.4 F (36.9 C), temperature source Oral, resp. rate 18, height 5\' 4"  (1.626 m), weight 64 kg (141 lb), SpO2 100 %.Body mass index is 24.2 kg/m.  General Appearance: Casual and Fairly Groomed  Eye Contact:  Good  Speech:  Clear and Coherent and Normal Rate  Volume:  Normal  Mood:  Irritable  Affect:  Appropriate and Congruent  Thought Process:  Coherent and Goal Directed  Orientation:  Full (Time, Place, and Person)  Thought Content:  Logical  Suicidal Thoughts:  No  Homicidal Thoughts:  No  Memory:  Immediate;   Fair Recent;   Fair Remote;   Fair  Judgement:  Poor  Insight:  Lacking  Psychomotor Activity:  Normal  Concentration:  Concentration: Fair  Recall:  [employer] of Knowledge:  Fair  Language:  Fair  Akathisia:  No  Handed:    AIMS (if indicated):     Assets:  Desire for Improvement Resilience Social Support  ADL's:  Intact  Cognition:  WNL  Sleep:  Number of Hours: 6.75   Treatment Plan Summary: Daily contact with patient to assess and evaluate symptoms and progress in treatment and Medication management   -Continue inpatient hospitalization  -Bipolar I, current episode depressed   -Start abilify 10mg  po qDay  -Alcohol withdrawal     -Continue librium 25mg  po q6h prn CIWA >10  -Anxiety    -Continue vistaril 25mg  po q6h prn anxiety  - agitation     -Continue zydis/ativan/geodon  -Encourage participation in groups and therapeutic milieu  -Disposition planning will be ongoing  Micheal Likens, MD 11/25/2017, 12:56 PM

## 2017-11-25 NOTE — Progress Notes (Signed)
Nursing note 7p-7a  Pt observed interacting with peers on unit this shift. Displayed a bright affect and pleasant/ anxious mood upon interaction with this Clinical research associatewriter. Pt denies pain ,denies SI/HI, and also denies audio or visual hallucinations at this time. Pt complains of insomnia, anxiety, some irritability see MAR.  Pt able to contract for safety.  Pt is now resting in bed with eyes closed with no signs or symptoms of pain or distress noted. Pt continues to remain safe on the unit and is observed by rounding every 15 min. RN will continue to monitor.

## 2017-11-25 NOTE — [facility] Suicide Risk Assessment (Signed)
[facility] INPATIENT:  Family/Significant Other Suicide Prevention Education  Suicide Prevention Education:  Education Completed; Mother, Neil CrouchLateshia Barley, [phone] has been identified by the patient as the family member/significant other with whom the patient will be residing, and identified as the person(s) who will aid the patient in the event of a mental health crisis (suicidal ideations/suicide attempt).  With written consent from the patient, the family member/significant other has been provided the following suicide prevention education, prior to the and/or following the discharge of the patient.  The suicide prevention education provided includes the following:  Suicide risk factors  Suicide prevention and interventions  National Suicide Hotline telephone number  [facility] assessment telephone number  [facility] Emergency Assistance 911  [facility] and/or Residential Mobile Crisis Unit telephone number  Request made of family/significant other to:  Remove weapons (e.g., guns, rifles, knives), all items previously/currently identified as safety concern.    Remove drugs/medications (over-the-counter, prescriptions, illicit drugs), all items previously/currently identified as a safety concern.  The family member/significant other verbalizes understanding of the suicide prevention education information provided.  The family member/significant other agrees to remove the items of safety concern listed above. Mother is concerned about patient as she has anger management issues with both family and others.  Rendered her unable to keep a job.  Also concerned about paranoia and on-going alcohol use.  Baldo DaubRodney B [facility] 11/25/2017, 4:04 PM

## 2017-11-25 NOTE — Progress Notes (Signed)
Recreation Therapy Notes  Date: 6.17.19 Time: 1000 Location: 500 Hall Dayroom  Group Topic: Triggers  Goal Area(s) Addresses:  Patient will be able to identify triggers.  Patient will identify problems caused by triggers. Patient will identify three coping skills to deal with triggers.  Behavioral Response: Minimal  Intervention: Worksheet  Activity: Triggers.  LRT gave patients a worksheet in which patients were to identify their triggers, the problems their triggers contribute to and come up with a trigger for each category (emotional state, people, places, things, thoughts and activities/situations) presented.  Education:Communication, Discharge Planning  Education Outcome: Acknowledges understanding/In group clarification offered/Needs additional education.   Clinical Observations/Feedback:  Pt stated she didn't have any triggers.  Pt stated her coping skills were listening and learning.    Caroll RancherMarjette Aarion Kittrell, LRT/CTRS     Caroll RancherLindsay, Jerra Huckeby A 11/25/2017 12:24 PM

## 2017-11-25 NOTE — Tx Team (Signed)
Interdisciplinary Treatment and Diagnostic Plan Update  11/25/2017 Time of Session: 10:17 AM  Kari Luna MRN: [id number]  Principal Diagnosis: Major depressive disorder, recurrent severe without psychotic features ([place])  Secondary Diagnoses: Principal Problem:   Major depressive disorder, recurrent severe without psychotic features ([place])   Current Medications:  Current Facility-Administered Medications  Medication Dose Route Frequency Provider Last Rate Last Dose  . acetaminophen (TYLENOL) tablet 650 mg  650 mg Oral Q4H PRN Patrecia Pour, NP      . chlordiazePOXIDE (LIBRIUM) capsule 25 mg  25 mg Oral Q6H PRN Cobos, Fernando A, MD      . feeding supplement (ENSURE ENLIVE) (ENSURE ENLIVE) liquid 237 mL  237 mL Oral BID BM Patrecia Pour, NP   237 mL at 11/24/17 1630  . hydrOXYzine (ATARAX/VISTARIL) tablet 25 mg  25 mg Oral Q6H PRN Cobos, Myer Peer, MD   25 mg at 11/24/17 2101  . OLANZapine zydis (ZYPREXA) disintegrating tablet 10 mg  10 mg Oral Q8H PRN Patrecia Pour, NP   10 mg at 11/24/17 2101   And  . LORazepam (ATIVAN) tablet 1 mg  1 mg Oral PRN Patrecia Pour, NP       And  . ziprasidone (GEODON) injection 20 mg  20 mg Intramuscular PRN Patrecia Pour, NP      . multivitamin with minerals tablet 1 tablet  1 tablet Oral Daily Cobos, Myer Peer, MD   1 tablet at 11/24/17 1809  . nicotine (NICODERM CQ - dosed in mg/24 hours) patch 21 mg  21 mg Transdermal Daily Patrecia Pour, NP   21 mg at 11/24/17 0749  . thiamine (VITAMIN B-1) tablet 100 mg  100 mg Oral Daily Cobos, Myer Peer, MD        PTA Medications: Medications Prior to Admission  Medication Sig Dispense Refill Last Dose  . ibuprofen (ADVIL,MOTRIN) 800 MG tablet Take 1 tablet (800 mg total) by mouth 3 (three) times daily. (Patient not taking: Reported on 11/23/2017) 21 tablet 0 Not Taking at Unknown time  . naproxen (NAPROSYN) 500 MG tablet Take 1 tablet (500 mg total) by mouth 2 (two) times daily. Take for  mild/moderate pain as needed. (Patient not taking: Reported on 11/23/2017) 30 tablet 0 Not Taking at Unknown time    Patient Stressors: Legal issue Loss of grandmother and aunt Occupational concerns  Patient Strengths: Average or above average intelligence Capable of independent living Communication skills Physical Health  Treatment Modalities: Medication Management, Group therapy, Case management,  1 to 1 session with clinician, Psychoeducation, Recreational therapy.   Physician Treatment Plan for Primary Diagnosis: Major depressive disorder, recurrent severe without psychotic features ([place]) Long Term Goal(s): Improvement in symptoms so as ready for discharge  Short Term Goals: Ability to identify changes in lifestyle to reduce recurrence of condition will improve Ability to verbalize feelings will improve Ability to disclose and discuss suicidal ideas Ability to demonstrate self-control will improve Ability to identify and develop effective coping behaviors will improve Ability to demonstrate self-control will improve Ability to identify and develop effective coping behaviors will improve Ability to maintain clinical measurements within normal limits will improve Compliance with prescribed medications will improve Ability to identify triggers associated with substance abuse/mental health issues will improve  Medication Management: Evaluate patient's response, side effects, and tolerance of medication regimen.  Therapeutic Interventions: 1 to 1 sessions, Unit Group sessions and Medication administration.  Evaluation of Outcomes: Progressing  Physician Treatment Plan for Secondary Diagnosis: Principal Problem:  Major depressive disorder, recurrent severe without psychotic features ([place])   Long Term Goal(s): Improvement in symptoms so as ready for discharge  Short Term Goals: Ability to identify changes in lifestyle to reduce recurrence of condition will improve Ability to  verbalize feelings will improve Ability to disclose and discuss suicidal ideas Ability to demonstrate self-control will improve Ability to identify and develop effective coping behaviors will improve Ability to demonstrate self-control will improve Ability to identify and develop effective coping behaviors will improve Ability to maintain clinical measurements within normal limits will improve Compliance with prescribed medications will improve Ability to identify triggers associated with substance abuse/mental health issues will improve  Medication Management: Evaluate patient's response, side effects, and tolerance of medication regimen.  Therapeutic Interventions: 1 to 1 sessions, Unit Group sessions and Medication administration.  Evaluation of Outcomes: Progressing   RN Treatment Plan for Primary Diagnosis: Major depressive disorder, recurrent severe without psychotic features ([place]) Long Term Goal(s): Knowledge of disease and therapeutic regimen to maintain health will improve  Short Term Goals: Ability to identify and develop effective coping behaviors will improve and Compliance with prescribed medications will improve  Medication Management: RN will administer medications as ordered by provider, will assess and evaluate patient's response and provide education to patient for prescribed medication. RN will report any adverse and/or side effects to prescribing provider.  Therapeutic Interventions: 1 on 1 counseling sessions, Psychoeducation, Medication administration, Evaluate responses to treatment, Monitor vital signs and CBGs as ordered, Perform/monitor CIWA, COWS, AIMS and Fall Risk screenings as ordered, Perform wound care treatments as ordered.  Evaluation of Outcomes: Progressing   LCSW Treatment Plan for Primary Diagnosis: Major depressive disorder, recurrent severe without psychotic features ([place]) Long Term Goal(s): Safe transition to appropriate next level of care at  discharge, Engage patient in therapeutic group addressing interpersonal concerns.  Short Term Goals: Engage patient in aftercare planning with referrals and resources  Therapeutic Interventions: Assess for all discharge needs, 1 to 1 time with Social worker, Explore available resources and support systems, Assess for adequacy in community support network, Educate family and significant other(s) on suicide prevention, Complete Psychosocial Assessment, Interpersonal group therapy.  Evaluation of Outcomes: Met  States she will go to [place], follow up outpt there   Progress in Treatment: Attending groups: Yes Participating in groups: Yes Taking medication as prescribed: Yes Toleration medication: Yes, no side effects reported at this time Family/Significant other contact made: Yes Patient understands diagnosis: No Limited insight Discussing patient identified problems/goals with staff: Yes Medical problems stabilized or resolved: Yes Denies suicidal/homicidal ideation: Yes Issues/concerns per patient self-inventory: None Other: N/A  New problem(s) identified: None identified at this time.   New Short Term/Long Term Goal(s): "I need to leave here today because I have a TASK class tomorrow that is mandated by my parole.  I am trying to better myself."   Discharge Plan or Barriers:   Reason for Continuation of Hospitalization: Paranoia Mood Instability Medication stabilization   Estimated Length of Stay: 6/21  Attendees: Patient: Kari Luna 11/25/2017  10:17 AM  Physician: Maris Berger, MD 11/25/2017  10:17 AM  Nursing: Baldo Daub RN 11/25/2017  10:17 AM  RN Care Manager: Lars Pinks, RN 11/25/2017  10:17 AM  Social Worker: Ripley Fraise 11/25/2017  10:17 AM  Recreational Therapist: Winfield Cunas 11/25/2017  10:17 AM  Other: Norberto Sorenson 11/25/2017  10:17 AM  Other:  11/25/2017  10:17 AM    Scribe for Treatment Team:  Roque Lias LCSW 11/25/2017 10:17 AM

## 2017-11-25 NOTE — Progress Notes (Signed)
Recreation Therapy Notes  INPATIENT RECREATION THERAPY ASSESSMENT  Patient Details Name: Kari AlleyKendra Luna MRN: [id number] DOB: Jul 06, 1990 Today's Date: 11/25/2017       Information Obtained From: Patient  Able to Participate in Assessment/Interview: Yes  Patient Presentation: Alert, Oriented  Reason for Admission (Per Patient): Suicidal Ideation, Other (Comments)(Pt stated she didn't know but later admitted to taking an overdose of pills.)  Patient Stressors: Other (Comment)(Getting living situation worked out)  PharmacologistCoping Skills:   TV, Music, Exercise, Meditate, Talk, Prayer, Avoidance, Hot Bath/Shower  Leisure Interests (2+):  Social - Social Media, Individual - Other (Comment)(Talk on phone)  Frequency of Recreation/Participation: Other (Comment)(Daily)  Awareness of Community Resources:  No  Community Resources:     Current Use:    If no, Barriers?:    Expressed Interest in [employer] Corporation[employer] Resource Information: No  County of Residence:     Patient Main Form of Transportation: Therapist, musicublic Transportation  Patient Strengths:  Get self together; Stay positive  Patient Identified Areas of Improvement:  Get a job; Learn to trust people  Patient Goal for Hospitalization:  "Get out of here and get myself together".  Current SI (including self-harm):  No  Current HI:  No  Current AVH: No  Staff Intervention Plan: Group Attendance, Collaborate with Interdisciplinary Treatment Team  Consent to Intern Participation: N/A    Caroll RancherMarjette Savannha Welle, LRT/CTRS   Caroll RancherLindsay, Yaslene Lindamood A 11/25/2017, 2:50 PM

## 2017-11-25 NOTE — Plan of Care (Signed)
Pt continues to progress towards goals and d/c. RN will continue to monitor.  

## 2017-11-26 MED ORDER — NICOTINE 21 MG/24HR TD PT24: 21.0000 mg | MEDICATED_PATCH | Freq: Every day | TRANSDERMAL | 0 refills | Status: AC

## 2017-11-26 MED ORDER — ARIPIPRAZOLE 10 MG PO TABS: 10.0000 mg | ORAL_TABLET | Freq: Every day | ORAL | 0 refills | Status: AC

## 2017-11-26 MED ORDER — HYDROXYZINE HCL 25 MG PO TABS: ORAL_TABLET | ORAL | 0 refills | Status: AC

## 2017-11-26 NOTE — Plan of Care (Signed)
  Problem: Education: Goal: Knowledge of [facility] Education information/materials will improve Outcome: Completed/Met Goal: Emotional status will improve Outcome: Completed/Met Goal: Mental status will improve Outcome: Completed/Met Goal: Verbalization of understanding the information provided will improve Outcome: Completed/Met   Problem: Activity: Goal: Interest or engagement in activities will improve Outcome: Completed/Met Goal: Sleeping patterns will improve Outcome: Completed/Met   Problem: Coping: Goal: Ability to verbalize frustrations and anger appropriately will improve Outcome: Completed/Met Goal: Ability to demonstrate self-control will improve Outcome: Completed/Met   Problem: Health Behavior/Discharge Planning: Goal: Identification of resources available to assist in meeting health care needs will improve Outcome: Completed/Met Goal: Compliance with treatment plan for underlying cause of condition will improve Outcome: Completed/Met   Problem: Physical Regulation: Goal: Ability to maintain clinical measurements within normal limits will improve Outcome: Completed/Met   Problem: Safety: Goal: Periods of time without injury will increase Outcome: Completed/Met   Problem: Education: Goal: Knowledge of [facility] Education information/materials will improve Outcome: Completed/Met Goal: Emotional status will improve Outcome: Completed/Met Goal: Mental status will improve Outcome: Completed/Met Goal: Verbalization of understanding the information provided will improve Outcome: Completed/Met   Problem: Activity: Goal: Interest or engagement in activities will improve Outcome: Completed/Met Goal: Sleeping patterns will improve Outcome: Completed/Met   Problem: Coping: Goal: Ability to verbalize frustrations and anger appropriately will improve Outcome: Completed/Met Goal: Ability to demonstrate self-control will improve Outcome:  Completed/Met   Problem: Health Behavior/Discharge Planning: Goal: Identification of resources available to assist in meeting health care needs will improve Outcome: Completed/Met Goal: Compliance with treatment plan for underlying cause of condition will improve Outcome: Completed/Met   Problem: Physical Regulation: Goal: Ability to maintain clinical measurements within normal limits will improve Outcome: Completed/Met   Problem: Safety: Goal: Periods of time without injury will increase Outcome: Completed/Met   Problem: Education: Goal: Ability to make informed decisions regarding treatment will improve Outcome: Completed/Met   Problem: Coping: Goal: Coping ability will improve Outcome: Completed/Met   Problem: Health Behavior/Discharge Planning: Goal: Identification of resources available to assist in meeting health care needs will improve Outcome: Completed/Met   Problem: Medication: Goal: Compliance with prescribed medication regimen will improve Outcome: Completed/Met   Problem: Self-Concept: Goal: Ability to disclose and discuss suicidal ideas will improve Outcome: Completed/Met Goal: Will verbalize positive feelings about self Outcome: Completed/Met   Problem: Education: Goal: Ability to state activities that reduce stress will improve Outcome: Completed/Met   Problem: Coping: Goal: Ability to identify and develop effective coping behavior will improve Outcome: Completed/Met   Problem: Self-Concept: Goal: Ability to identify factors that promote anxiety will improve Outcome: Completed/Met Goal: Level of anxiety will decrease Outcome: Completed/Met Goal: Ability to modify response to factors that promote anxiety will improve Outcome: Completed/Met

## 2017-11-26 NOTE — Progress Notes (Signed)
Recreation Therapy Notes  INPATIENT RECREATION TR PLAN  Patient Details Name: Kari Luna MRN: [id number] DOB: Jun 10, 1991 Today's Date: 11/26/2017  Rec Therapy Plan Is patient appropriate for Therapeutic Recreation?: Yes Treatment times per week: about 3 days Estimated Length of Stay: 5-7 days TR Treatment/Interventions: Group participation (Comment)  Discharge Criteria Pt will be discharged from therapy if:: Discharged Treatment plan/goals/alternatives discussed and agreed upon by:: Patient/family  Discharge Summary Short term goals set: See patient care plan Short term goals met: Not met Progress toward goals comments: Groups attended Which groups?: Other (Comment)(Triggers) Reason goals not met: Pt being discharged Therapeutic equipment acquired: None Reason patient discharged from therapy: Discharge from hospital Pt/family agrees with progress & goals achieved: Yes Date patient discharged from therapy: 11/26/17   Victorino Sparrow, LRT/CTRS   Ria Comment, Britteny Fiebelkorn A 11/26/2017, 2:10 PM

## 2017-11-26 NOTE — Progress Notes (Signed)
Discharge note:  Patient discharged home per MD orders.  Patient received all personal belongings, including her charger.  She received prescriptions and samples of abilify and vistaril.  Reviewed AVS/transition record with patient and she indicated understanding.  She denies any thoughts of self harm.  Patient left ambulatory with 2 bus tickets [facility].

## 2017-11-26 NOTE — Progress Notes (Signed)
  [facility] Discharge Plan :  Will you be returning to the same living situation after discharge:  No. At discharge, do you have transportation home?: Yes,  bus pass Do you have the ability to pay for your medications: Yes,  mental health  Release of information consent forms completed and in the chart;  Patient's signature needed at discharge.  Patient to Follow up at: Follow-up Information    [facility] Follow up on 11/29/2017.   Why:  Friday morning at 9:15 AM with [facility] for your h[facility]ital follow up appointment.  Bring your ID and your hospital d/c paperwork. Contact information: [address] [place] [address] [phone]           Next level of care provider has access to [facility] Link:no  Safety Planning and Suicide Prevention discussed: Yes,  yes  Have you used any form of tobacco in the last 30 days? (Cigarettes, Smokeless Tobacco, Cigars, and/or Pipes): Patient Refused Screening  Has patient been referred to the Quitline?: Patient refused referral  Patient has been referred for addiction treatment: Pt. refused referral  Ida RogueRodney B Lashaundra Lehrmann, LCSW 11/26/2017, 1:38 PM

## 2017-11-26 NOTE — Progress Notes (Signed)
Recreation Therapy Notes  Date: 6.18.19 Time: 1000 Location: 500 Hall Dayroom   Group Topic: Communication, Team Building, Problem Solving  Goal Area(s) Addresses:  Patient will effectively work with peer towards shared goal.  Patient will identify skills used to make activity successful.  Patient will identify how skills used during activity can be used to reach post d/c goals.   Intervention: STEM Activity  Activity: Landing Pad. In teams patients were given 12 plastic drinking straws and a length of masking tape. Using the materials provided patients were asked to build a landing pad to catch a golf ball dropped from approximately 6 feet in the air.   Education: Social Skills, Discharge Planning   Education Outcome: Acknowledges education/In group clarification offered/Needs additional education.   Clinical Observations/Feedback: Pt did not attend group.    Natthew Marlatt, LRT/CTRS         Janet Humphreys A 11/26/2017 11:59 AM 

## 2017-11-26 NOTE — Progress Notes (Signed)
Adult Psychoeducational Group Note  Date:  11/26/2017 Time:  12:11 AM  Group Topic/Focus:  Wrap-Up Group:   The focus of this group is to help patients review their daily goal of treatment and discuss progress on daily workbooks.  Participation Level:  Did Not Attend  Participation Quality:  Did Not Attend  Affect:  Did Not Attend  Cognitive:  Did Not Attend  Insight: None  Engagement in Group:  Did Not Attend  Modes of Intervention:  Did Not Attend  Additional Comments: Pt did not attend evening wrap up group tonight.  Kari FurnaceChristopher  [place] Luna 11/26/2017, 12:11 AM

## 2017-11-26 NOTE — Discharge Summary (Addendum)
Physician Discharge Summary Note  Patient:  Kari Luna is an 27 y.o., female MRN:  [id number] DOB:  05/08/1991 Patient phone:  [phone] (home)  Patient address:   [address] [place] [address],   Total Time spent with patient: Greater than 30 minutes  Date of Admission:  11/23/2017 Date of Discharge: 11-26-17  Reason for Admission: Intentional overdose of Remeron & expressing worsening symptoms of depression.  Principal Problem: Bipolar I disorder, most recent episode depressed, severe without psychotic features [facility](HCC)  Discharge Diagnoses: Patient Active Problem List   Diagnosis Date Noted  . Bipolar I disorder, most recent episode depressed, severe without psychotic features (HCC) [F31.4] 11/23/2017  . Cocaine abuse (HCC) [F14.10] 05/19/2015  . Alcohol use disorder, moderate, dependence (HCC) [F10.20]   . Alcohol abuse [F10.10] 07/28/2014  . Substance induced mood disorder (HCC) [F19.94] 07/28/2014  . Suicidal ideation [R45.851] 07/28/2014   Past Psychiatric History: BP, Cocaine & alcohol use disorder.  Past Medical History:  Past Medical History:  Diagnosis Date  . Bipolar disorder [facility](HCC)     Past Surgical History:  Procedure Laterality Date  . CARDIAC SURGERY     Family History: History reviewed. No pertinent family history.  Family Psychiatric  History: See H&P  Social History:  Social History   Substance and Sexual Activity  Alcohol Use Yes  . Alcohol/week: 6.0 oz  . Types: 10 Cans of beer per week   Comment: occ     Social History   Substance and Sexual Activity  Drug Use No    Social History   Socioeconomic History  . Marital status: Single    Spouse name: Not on file  . Number of children: Not on file  . Years of education: Not on file  . Highest education level: Not on file  Occupational History  . Not on file  Social Needs  . Financial resource strain: Not on file  . Food insecurity:    Worry: Not on file    Inability: Not on file  .  Transportation needs:    Medical: Not on file    Non-medical: Not on file  Tobacco Use  . Smoking status: Current Every Day Smoker    Packs/day: 1.00    Types: Cigarettes  . Smokeless tobacco: Never Used  Substance and Sexual Activity  . Alcohol use: Yes    Alcohol/week: 6.0 oz    Types: 10 Cans of beer per week    Comment: occ  . Drug use: No  . Sexual activity: Never  Lifestyle  . Physical activity:    Days per week: Not on file    Minutes per session: Not on file  . Stress: Not on file  Relationships  . Social connections:    Talks on phone: Not on file    Gets together: Not on file    Attends religious service: Not on file    Active member of club or organization: Not on file    Attends meetings of clubs or organizations: Not on file    Relationship status: Not on file  Other Topics Concern  . Not on file  Social History Narrative  . Not on file   Hospital Course: (Per Md's discharge SRA): Kari AlleyKendra Lesperance is a 27 y/o F with history of bipolar I, who [facility] from [facility] placed in ED after taking intentional overdose of remeron and expressing worsening symptoms of depression. Pt was medically stabilized and [facility]  [facility]. She was started on PRN of librium for possible alcohol withdrawal. She was started on trial of abilify for her mood symptoms. She reported improvement of her presenting symptoms.  Today upon evaluation, pt shares, "I'm good - I'm just ready to go." She denies any specific concerns. She is sleeping well. Her appetite is good. She denies SI/HI/AH/VH. She is tolerating her medications without difficulty or side effects. She is in agreement to continue her current regimen without changes. Pt's parole officer, Donneta Romberg, was contacted with patient and SW team present, and we coordinated regarding pt's discharge plan. Pt plans to [facility] and to follow up at [facility]. There were no safety concerns  from pt's parole officer regarding the plan for discharge. She was able to engage in safety planning including plan to return to [facility] or contact emergency services if she feels unable to maintain her own safety or the safety of others. Pt had no further questions, comments, or concerns.  Plan Of Care/Follow-up recommendations:   -Discharge to outpatient level of care  -Bipolar I, current episode depressed -Continue abilify 10mg  po qDay  -Anxiety -Continue vistaril 25mg  po q6h prn anxiety  Activity:  as tolerated Diet:  normal Tests:  NA    Physical Findings: AIMS: Facial and Oral Movements Muscles of Facial Expression: None, normal Lips and Perioral Area: None, normal Jaw: None, normal Tongue: None, normal,Extremity Movements Upper (arms, wrists, hands, fingers): None, normal Lower (legs, knees, ankles, toes): None, normal, Trunk Movements Neck, shoulders, hips: None, normal, Overall Severity Severity of abnormal movements (highest score from questions above): None, normal Incapacitation due to abnormal movements: None, normal Patient's awareness of abnormal movements (rate only patient's report): No Awareness, Dental Status Current problems with teeth and/or dentures?: No Does patient usually wear dentures?: No  CIWA:  CIWA-Ar Total: 1 COWS:     Musculoskeletal: Strength & Muscle Tone: within normal limits Gait & Station: normal Patient leans: N/A  Psychiatric Specialty Exam: Physical Exam  Constitutional: She appears well-developed.  HENT:  Head: Normocephalic.  Eyes: Pupils are equal, round, and reactive to light.  Neck: Normal range of motion.  Cardiovascular: Normal rate.  Respiratory: Effort normal.  GI: Soft.  Genitourinary:  Genitourinary Comments: Deferred  Musculoskeletal: Normal range of motion.  Neurological: She is alert.  Skin: Skin is warm.    Review of Systems  Constitutional: Negative.   HENT:  Negative.   Eyes: Negative.   Respiratory: Negative.   Cardiovascular: Negative.   Gastrointestinal: Negative.   Genitourinary: Negative.   Musculoskeletal: Negative.   Skin: Negative.   Neurological: Negative.   Endo/Heme/Allergies: Negative.   Psychiatric/Behavioral: Positive for depression (Stabilized with medication prior to discharge), hallucinations (Hx. psychosis (stabilized with medication prior to discharge) and substance abuse (Hx. alcohol use disorder). Negative for memory loss and suicidal ideas. The patient is not nervous/anxious and does not have insomnia.     Blood pressure 103/67, pulse 91, temperature 98.5 F (36.9 C), temperature source Oral, resp. rate 18, height 5\' 4"  (1.626 m), weight 64 kg (141 lb), SpO2 100 %.Body mass index is 24.2 kg/m.  See Md's SRA   Have you used any form of tobacco in the last 30 days? (Cigarettes, Smokeless Tobacco, Cigars, and/or Pipes): Patient Refused Screening  Has this patient used any form of tobacco in the last 30 days? (Cigarettes, Smokeless Tobacco, Cigars, and/or Pipes): Yes, an FDA-approved tobacco cessation medication was offered at discharge.  Blood Alcohol level:  Lab Results  Component Value  Date   ETH 204 (H) 11/23/2017   ETH 88 (H) 05/19/2015   Metabolic Disorder Labs:  No results found for: HGBA1C, MPG No results found for: PROLACTIN No results found for: CHOL, TRIG, HDL, CHOLHDL, VLDL, LDLCALC  See Psychiatric Specialty Exam and Suicide Risk Assessment completed by Attending Physician prior to discharge.  Discharge destination:  Home  Is patient on multiple antipsychotic therapies at discharge:  No   Has Patient had three or more failed trials of antipsychotic monotherapy by history:  No  Recommended Plan for Multiple Antipsychotic Therapies: NA  Allergies as of 11/26/2017      Reactions   Penicillins Nausea And Vomiting   Has patient had a PCN reaction causing immediate rash, facial/tongue/throat swelling,  SOB or lightheadedness with hypotension: No Has patient had a PCN reaction causing severe rash involving mucus membranes or skin necrosis: No Has patient had a PCN reaction that required hospitalization No Has patient had a PCN reaction occurring within the last 10 years: No If all of the above answers are "NO", then may proceed with Cephalosporin use.      Medication List    STOP taking these medications   ibuprofen 800 MG tablet Commonly known as:  ADVIL,MOTRIN   naproxen 500 MG tablet Commonly known as:  NAPROSYN     TAKE these medications     Indication  ARIPiprazole 10 MG tablet Commonly known as:  ABILIFY Take 1 tablet (10 mg total) by mouth daily. For mood control Start taking on:  11/27/2017  Indication:  Mood control   hydrOXYzine 25 MG tablet Commonly known as:  ATARAX/VISTARIL Take 1 tablet (25 mg) by mouth every 6 hours as needed: For anxiety  Indication:  Feeling Anxious   nicotine 21 mg/24hr patch Commonly known as:  NICODERM CQ - dosed in mg/24 hours Place 1 patch (21 mg total) onto the skin daily. (May purchase from over the counter): For smoking cessation Start taking on:  11/27/2017  Indication:  Nicotine Addiction      Follow-up Information    [facility] Follow up on 11/29/2017.   Why:  Friday morning at 9:15 AM with [facility]ra Jabs at [facility] for your hosital follow up appointment.  Bring your ID and your hospital d/c paperwork. Contact information: [address] [place] [address] [phone]          Follow-up recommendations: Activity:  As tolerated Diet: As recommended by your primary care doctor. Keep all scheduled follow-up appointments as recommended.  Comments: Patient is instructed prior to discharge to: Take all medications as prescribed by his/her mental healthcare provider. Report any adverse effects and or reactions from the medicines to his/her outpatient provider promptly. Patient has been instructed &  cautioned: To not engage in alcohol and or illegal drug use while on prescription medicines. In the event of worsening symptoms, patient is instructed to call the crisis hotline, 911 and or go to the nearest ED for appropriate evaluation and treatment of symptoms. To follow-up with his/her primary care provider for your other medical issues, concerns and or health care needs.   Signed: Armandina Stammer, NP 11/26/2017, 1:39 PM   Patient seen, Suicide Assessment Completed.  Disposition Plan Reviewed

## 2017-11-26 NOTE — Progress Notes (Signed)
Patient denies SI, HI and AVH.  Patient is eager to discharge this shift.  Patient reports that she doesn't need to be here any longer.    Assess patient for safety, offer medications as prescribed, engage patient in 1:1 staff talks.   Patient able to contract for safety. Continue to monitor as planned.

## 2017-11-26 NOTE — [facility] Suicide Risk Assessment (Signed)
[facility] Discharge Suicide Risk Assessment   Principal Problem: Bipolar I disorder, most recent episode depressed, severe without psychotic features [facility]) Discharge Diagnoses:  Patient Active Problem List   Diagnosis Date Noted  . Bipolar I disorder, most recent episode depressed, severe without psychotic features (HCC) [F31.4] 11/23/2017  . Cocaine abuse (HCC) [F14.10] 05/19/2015  . Alcohol use disorder, moderate, dependence (HCC) [F10.20]   . Alcohol abuse [F10.10] 07/28/2014  . Substance induced mood disorder (HCC) [F19.94] 07/28/2014  . Suicidal ideation [R45.851] 07/28/2014    Total Time spent with patient: 30 minutes  Musculoskeletal: Strength & Muscle Tone: within normal limits Gait & Station: normal Patient leans: N/A  Psychiatric Specialty Exam: Review of Systems  Constitutional: Negative for chills and fever.  Respiratory: Negative for cough and shortness of breath.   Cardiovascular: Negative for chest pain.  Gastrointestinal: Negative for abdominal pain, heartburn, nausea and vomiting.  Psychiatric/Behavioral: Negative for depression, hallucinations and suicidal ideas. The patient is not nervous/anxious and does not have insomnia.     Blood pressure 103/67, pulse 91, temperature 98.5 F (36.9 C), temperature source Oral, resp. rate 18, height 5\' 4"  (1.626 m), weight 64 kg (141 lb), SpO2 100 %.Body mass index is 24.2 kg/m.  General Appearance: Casual and Fairly Groomed  Patent attorney::  Good  Speech:  Clear and Coherent and Normal Rate  Volume:  Normal  Mood:  Euthymic  Affect:  Appropriate and Congruent  Thought Process:  Coherent and Goal Directed  Orientation:  Full (Time, Place, and Person)  Thought Content:  Logical  Suicidal Thoughts:  No  Homicidal Thoughts:  No  Memory:  Immediate;   Fair Recent;   Fair Remote;   Fair  Judgement:  Fair  Insight:  Lacking  Psychomotor Activity:  Normal  Concentration:  Fair  Recall:  Good  Fund of Knowledge:Fair   Language: Good  Akathisia:  No  Handed:    AIMS (if indicated):     Assets:  Communication Skills Physical Health Resilience  Sleep:  Number of Hours: 6.75  Cognition: WNL  ADL's:  Intact   Mental Status Per Nursing Assessment::   On Admission:  NA  Demographic Factors:  Low socioeconomic status, Living alone and Unemployed  Loss Factors: Legal issues and Financial problems/change in socioeconomic status  Historical Factors: Impulsivity  Risk Reduction Factors:   Positive social support, Positive therapeutic relationship and Positive coping skills or problem solving skills  Continued Clinical Symptoms:  Bipolar Disorder:   Mixed State  Cognitive Features That Contribute To Risk:  None    Suicide Risk:  Minimal: No identifiable suicidal ideation.  Patients presenting with no risk factors but with morbid ruminations; may be classified as minimal risk based on the severity of the depressive symptoms  Follow-up Information    [facility] Follow up on 11/29/2017.   Why:  Friday morning at 9:15 AM with Nyra Jabs at [facility] for your hosital follow up appointment.  Bring your ID and your hospital d/c paperwork. Contact information: [address] [place] [address] [phone]         Subjective Data:  Kari Luna is a 27 y/o F with history of bipolar I, who was [facility] from [facility] on [facility]C placed in ED after taking intentional overdose of remeron and expressing worsening symptoms of depression. Pt was medically stabilized and then transferred to [facility] for additional treatment and stabilization. She was started on PRN of librium for possible alcohol withdrawal. She was started on  trial of abilify for her mood symptoms. She reported improvement of her presenting symptoms.  Today upon evaluation, pt shares, "I'm good - I'm just ready to go." She denies any specific concerns. She is sleeping well. Her appetite is good. She denies SI/HI/AH/VH. She is  tolerating her medications without difficulty or side effects. She is in agreement to continue her current regimen without changes. Pt's parole officer, Donneta RombergJasmine Neil, was contacted with patient and SW team present, and we coordinated regarding pt's discharge plan. [facility]. There were no safety concerns from pt's parole officer regarding the plan for discharge. She was able to engage in safety planning including plan to [facility] or contact emergency services if she feels unable to maintain her own safety or the safety of others. Pt had no further questions, comments, or concerns.   Plan Of Care/Follow-up recommendations:   -Discharge to outpatient level of care  -Bipolar I, current episode depressed              -Continue abilify 10mg  po qDay  -Anxiety                        -Continue vistaril 25mg  po q6h prn anxiety  Activity:  as tolerated Diet:  normal Tests:  NA Other:  see above for DC plan  Micheal Likenshristopher T Bevan Vu, MD 11/26/2017, 1:39 PM

## 2017-11-26 NOTE — Progress Notes (Signed)
Nursing note 7p-7a  Pt observed isolating in room this shift. Displayed a flat affect and anxious/ irritable mood upon interaction with this Clinical research associatewriter. Pt denies pain ,denies SI/HI, and also denies audio or visual hallucinations at this time. Pt complained of having some anxiety, see MAR for PRN medication administration. Pt able to contract for safety. Pt is now resting in bed with eyes closed with no signs or symptoms of pain or distress noted. Pt continues to remain safe on the unit and is observed by rounding every 15 min. RN will continue to monitor.

## 2017-11-26 NOTE — Plan of Care (Signed)
Pt attended one group and is being discharged.   Caroll RancherMarjette Deaundra Dupriest, LRT/CTRS

## 2018-11-07 ENCOUNTER — Encounter (HOSPITAL_BASED_OUTPATIENT_CLINIC_OR_DEPARTMENT_OTHER): Payer: Self-pay | Admitting: *Deleted

## 2018-11-07 ENCOUNTER — Emergency Department (HOSPITAL_BASED_OUTPATIENT_CLINIC_OR_DEPARTMENT_OTHER)
Admission: EM | Admit: 2018-11-07 | Discharge: 2018-11-07 | Disposition: A | Discharge status: Home / Self Care | Payer: Self-pay | Attending: Emergency Medicine | Admitting: Emergency Medicine

## 2018-11-07 ENCOUNTER — Other Ambulatory Visit: Payer: Self-pay

## 2018-11-07 DIAGNOSIS — F17210 Nicotine dependence, cigarettes, uncomplicated: Secondary | ICD-10-CM | POA: Insufficient documentation

## 2018-11-07 DIAGNOSIS — Z79899 Other long term (current) drug therapy: Secondary | ICD-10-CM | POA: Insufficient documentation

## 2018-11-07 DIAGNOSIS — T7421XA Adult sexual abuse, confirmed, initial encounter: Secondary | ICD-10-CM | POA: Insufficient documentation

## 2018-11-07 LAB — PREGNANCY, URINE: Preg Test, Ur: NEGATIVE

## 2018-11-07 LAB — RAPID HIV SCREEN (HIV 1/2 AB+AG)
HIV 1/2 Antibodies: NONREACTIVE
HIV-1 P24 Antigen - HIV24: NONREACTIVE

## 2018-11-07 LAB — COMPREHENSIVE METABOLIC PANEL
ALT: 89 U/L — ABNORMAL HIGH (ref 0–44)
AST: 124 U/L — ABNORMAL HIGH (ref 15–41)
Albumin: 4.5 g/dL (ref 3.5–5.0)
Alkaline Phosphatase: 102 U/L (ref 38–126)
Anion gap: 13 (ref 5–15)
BUN: 9 mg/dL (ref 6–20)
CO2: 22 mmol/L (ref 22–32)
Calcium: 8.9 mg/dL (ref 8.9–10.3)
Chloride: 106 mmol/L (ref 98–111)
Creatinine, Ser: 0.65 mg/dL (ref 0.44–1.00)
GFR calc Af Amer: >60 mL/min (ref >60)
GFR calc non Af Amer: >60 mL/min (ref >60)
Glucose, Bld: 131 mg/dL — ABNORMAL HIGH (ref 70–99)
Potassium: 3.8 mmol/L (ref 3.5–5.1)
Sodium: 141 mmol/L (ref 135–145)
Total Bilirubin: 0.8 mg/dL (ref 0.3–1.2)
Total Protein: 8.3 g/dL — ABNORMAL HIGH (ref 6.5–8.1)

## 2018-11-07 MED ORDER — ULIPRISTAL ACETATE 30 MG PO TABS
30.0000 mg | ORAL_TABLET | Freq: Once | ORAL | Status: AC
  Administered 2018-11-07: 14:00:00 30 mg via ORAL
  Filled 2018-11-07: qty 1

## 2018-11-07 MED ORDER — ELVITEG-COBIC-EMTRICIT-TENOFAF 150-150-200-10 MG PO TABS: 1.0000 | ORAL_TABLET | Freq: Every day | ORAL | 0 refills | Status: DC

## 2018-11-07 MED ORDER — CEFTRIAXONE SODIUM 250 MG IJ SOLR
250.0000 mg | Freq: Once | INTRAMUSCULAR | Status: AC
  Administered 2018-11-07: 14:00:00 250 mg via INTRAMUSCULAR

## 2018-11-07 MED ORDER — ULIPRISTAL ACETATE 30 MG PO TABS
30.0000 mg | ORAL_TABLET | Freq: Once | ORAL | Status: AC
  Administered 2018-11-07: 15:00:00 30 mg via ORAL
  Filled 2018-11-07: qty 1

## 2018-11-07 MED ORDER — PROMETHAZINE HCL 25 MG PO TABS
25.0000 mg | ORAL_TABLET | Freq: Four times a day (QID) | ORAL | Status: DC | PRN
  Administered 2018-11-07: 15:00:00 25 mg via ORAL

## 2018-11-07 MED ORDER — ELVITEG-COBIC-EMTRICIT-TENOFAF 150-150-200-10 MG PREPACK
5.0000 | ORAL_TABLET | Freq: Once | ORAL | Status: AC
  Administered 2018-11-07: 15:00:00 29 via ORAL
  Filled 2018-11-07: qty 1
  Filled 2018-11-07: qty 5

## 2018-11-07 MED ORDER — AZITHROMYCIN 250 MG PO TABS
1000.0000 mg | ORAL_TABLET | Freq: Once | ORAL | Status: DC
  Filled 2018-11-07: qty 4

## 2018-11-07 MED ORDER — ELVITEG-COBIC-EMTRICIT-TENOFAF 150-150-200-10 MG PO TABS: 1.0000 | ORAL_TABLET | Freq: Every day | ORAL | 0 refills | Status: AC

## 2018-11-07 MED ORDER — ELVITEG-COBIC-EMTRICIT-TENOFAF 150-150-200-10 MG PO TABS
ORAL_TABLET | ORAL | Status: AC
  Filled 2018-11-07: qty 1

## 2018-11-07 MED ORDER — AZITHROMYCIN 250 MG PO TABS
1000.0000 mg | ORAL_TABLET | Freq: Once | ORAL | Status: AC
  Administered 2018-11-07: 1000 mg via ORAL

## 2018-11-07 MED ORDER — LIDOCAINE HCL (PF) 1 % IJ SOLN
0.9000 mL | Freq: Once | INTRAMUSCULAR | Status: AC
  Administered 2018-11-07: 14:00:00 0.9 mL
  Filled 2018-11-07: qty 5

## 2018-11-07 MED ORDER — METRONIDAZOLE 500 MG PO TABS
2000.0000 mg | ORAL_TABLET | Freq: Once | ORAL | Status: AC
  Administered 2018-11-07: 14:00:00 2000 mg via ORAL
  Filled 2018-11-07: qty 4

## 2018-11-07 MED FILL — GENVOYA TABLET: 150-150-200 | 30 days supply | Qty: 30 | Fill #0
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
## 2018-11-07 NOTE — ED Notes (Signed)
SANE Kit given to [employer], Haematologist.

## 2018-11-07 NOTE — ED Notes (Signed)
This rn speaks with sane nurse Judy Pimple, states her eta here is one hour.

## 2018-11-07 NOTE — SANE Note (Addendum)
On 11/07/2018, at approximately 1500 hours,the [employer] Portal with the [employer] ([employer]) was utilized to obtain the following voucher numbers for a 30-day supply of Genvoya (HIV nPEP):   BIN #: [id number]   PCN: [id number]   GROUP: [id number]   MEMBER #: [id number]   The voucher #'s were hand-delivered and emailed to the [facility] OP.  The pt was given 1 tab prior to being discharged from the [facility] ED.  The remaining 29-day supply of Genvoya (that was obtained from the [facility]), and was then sent home with the pt.   The pt vomited her previous STI prophylactic medications.  I advised the ED RN that if the pt vomited this medication then the pt would not be sent home with HIV nPEP, and that the entire prescription needed to be returned to the pharmacy.  The ED RN verbalized her understanding.   *Note the 5-day prepack (SANE- # 3) was returned back to the [facility] ED Pyxis.*
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
## 2018-11-07 NOTE — Discharge Instructions (Addendum)
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 other purposes; ask your health care provider or pharmacist if you have questions. COMMON BRAND NAME(S): Phenergan What should I tell my health care provider before I take this medicine? They need to know if you have any of these conditions: -glaucoma -high blood pressure or heart disease -kidney disease -liver disease -lung or breathing disease, like asthma -prostate trouble -pain or difficulty passing urine -seizures -an unusual or allergic reaction to promethazine or phenothiazines, other medicines, foods, dyes, or preservatives -pregnant or trying to get pregnant -breast-feeding How should I use this medicine? Take this medicine by mouth with a glass of water. Follow the directions on the prescription label. Take your doses at regular intervals. Do not take your medicine more often than directed. Talk to your pediatrician regarding the use of this medicine in children. Special care may be needed.  This medicine should not be given to infants and children younger than 36 years old. Overdosage: If you think you have taken too much of this medicine contact a poison control center or emergency room at once. NOTE: This medicine is only for you. Do not share this medicine with others. What if I miss a dose? If you miss a dose, take it as soon as you can. If it is almost time for your next dose, take only that dose. Do not take double or extra doses. What may interact with this medicine? Do not take this medicine with any of the following medications: -cisapride -dofetilide -dronedarone -MAOIs like Carbex, Eldepryl, Marplan, Nardil, Parnate -pimozide -quinidine, including dextromethorphan; quinidine -thioridazine -ziprasidone This medicine may also interact with the following medications: -certain medicines for depression, anxiety, or psychotic disturbances -certain medicines for anxiety or sleep -certain medicines for seizures like carbamazepine, phenobarbital, phenytoin -certain medicines for movement abnormalities as in Parkinson's disease, or for gastrointestinal problems -epinephrine -medicines for allergies or colds -muscle relaxants -narcotic medicines for pain -other medicines that prolong the QT interval (cause an abnormal heart rhythm) -tramadol -trimethobenzamide This list may not describe all possible interactions. Give your health care provider a list of all the medicines, herbs, non-prescription drugs, or dietary supplements you use. Also tell them if you smoke, drink alcohol, or use illegal drugs. Some items may interact with your medicine. What should I watch for while using this medicine? Tell your doctor or health care professional if your symptoms do not start to get better in 1 to 2 days. You may get drowsy or dizzy. Do not drive, use machinery, or do anything that needs mental alertness until you know how this medicine affects you. To reduce the risk of dizzy or  fainting spells, do not stand or sit up quickly, especially if you are an older Kari Luna. Alcohol may increase dizziness and drowsiness. Avoid alcoholic drinks. Your mouth may get dry. Chewing sugarless gum or sucking hard candy, and drinking plenty of water may help. Contact your doctor if the problem does not go away or is severe. This medicine may cause dry eyes and blurred vision. If you wear contact lenses you may feel some discomfort. Lubricating drops may help. See your eye doctor if the problem does not go away or is severe. This medicine can make you more sensitive to the sun. Keep out of the sun. If you cannot avoid being in the sun, wear protective clothing and use sunscreen. Do not use sun lamps or tanning beds/booths. If you are diabetic, check your blood-sugar levels regularly. What side effects may I  notice from receiving this medicine? Side effects that you should report to your doctor or health care professional as soon as possible: -blurred vision -irregular heartbeat, palpitations or chest pain -muscle or facial twitches -pain or difficulty passing urine -seizures -skin rash -slowed or shallow breathing -unusual bleeding or bruising -yellowing of the eyes or skin Side effects that usually do not require medical attention (report to your doctor or health care professional if they continue or are bothersome): -headache -nightmares, agitation, nervousness, excitability, not able to sleep (these are more likely in children) -stuffy nose This list may not describe all possible side effects. Call your doctor for medical advice about side effects. You may report side effects to FDA at 1-800-FDA-1088. Where should I keep my medicine? Keep out of the reach of children. Store at room temperature, between 20 and 25 degrees C (68 and 77 degrees F). Protect from light. Throw away any unused medicine after the expiration date. NOTE: This sheet is a summary. It may not cover all possible  information. If you have questions about this medicine, talk to your doctor, pharmacist, or health care provider.  2017 Elsevier/Gold Standard (2013-01-27 15:04:46)   Ulipristal oral tablets (Ella)-1 TAB SENT HOME WITH Kari Luna; MAY CAUSE NAUSEA.  TAKE 30 MINUTES AFTER A MEAL. What is this medicine? ULIPRISTAL (UE li pris tal) is an emergency contraceptive. It prevents pregnancy if taken within 5 days (120 hours) after your birth control fails or you have unprotected sex. This medicine will not work if you are already pregnant. COMMON BRAND NAME(S): ella What should I tell my health care provider before I take this medicine? They need to know if you have any of these conditions: -an unusual or allergic reaction to ulipristal, other medicines, foods, dyes, or preservatives -pregnant or trying to get pregnant -breast-feeding How should I use this medicine? Take this medicine by mouth with or without food. Your doctor may want you to use a quick-response pregnancy test prior to using the tablets. Take your medicine as soon as possible and not more than 5 days (120 hours) after the event. This medicine can be taken at any time during your menstrual cycle. Follow the dose instructions of your health care provider exactly. Contact your health care provider right away if you vomit within 3 hours of taking your medicine to discuss if you need to take another tablet. A Kari Luna package insert for the product will be given with each prescription and refill. Read this sheet carefully each time. The sheet may change frequently. Contact your pediatrician regarding the use of this medicine in children. Special care may be needed. What if I miss a dose? This does not apply; this medicine is not for regular use. What may interact with this medicine? This medicine may interact with the following medications: -birth control pills -bosentan -certain medicines for fungal infections like griseofulvin,  itraconazole, and ketoconazole -certain medicines for seizures like barbiturates, carbamazepine, felbamate, oxcarbazepine, phenytoin, topiramate -dabigatran -digoxin -rifampin -St. John's Wort What should I watch for while using this medicine? Your period may begin a few days earlier or later than expected. If your period is more than 7 days late, pregnancy is possible. See your health care provider as soon as you can and get a pregnancy test. Talk to your healthcare provider before taking this medicine if you know or suspect that you are pregnant. Contact your healthcare provider if you think you may be pregnant and you have taken this medicine. Your healthcare provider may  wish to provide information on your pregnancy to help study the safety of this medicine during pregnancy. For information, go to [employer]. If you have severe abdominal pain about 3 to 5 weeks after taking this medicine, you may have a pregnancy outside the womb, which is called an ectopic or tubal pregnancy. Call your health care provider or go to the nearest emergency room right away if you think this is happening. Discuss birth control options with your health care provider. Emergency birth control is not to be used routinely to prevent pregnancy. It should not be used more than once in the same cycle. Birth control pills may not work properly while you are taking this medicine. Wait at least 5 days after taking this medicine to start or continue other hormone based birth control. Be sure to use a reliable barrier contraceptive method (such as a condom with spermicide) between the time you take this medicine and your next period. This medicine does not protect you against HIV infection (AIDS) or any other sexually transmitted diseases (STDs). What side effects may I notice from receiving this medicine? Side effects that you should report to your doctor or health care professional as soon as possible: -allergic reactions  like skin rash, itching or hives, swelling of the face, lips, or tongue Side effects that usually do not require medical attention (report to your doctor or health care professional if they continue or are bothersome): -dizziness -headache -nausea -spotting -stomach pain -tiredness Where should I keep my medicine? Keep out of the reach of children. Store at between 20 and 25 degrees C (68 and 77 degrees F). Protect from light and keep in the blister card inside the original box until you are ready to take it. Throw away any unused medicine after the expiration date.  2017 Elsevier/Gold Standard (2015-06-30 10:39:30)    Cobicistat; Elvitegravir; Emtricitabine; Tenofovir Alafenamide oral tablets; GENVOYA IS HIV nPEP  1 TAB GIVEN IN ED.  29 TABS SENT HOME WITH Kari Luna.    TAKE AT THE SAME TIME EVERY DAY, FOR 29 DAYS.  DO NOT MISS A DAY.  DO NOT CRUSH.  TAKE 30 MINUTES AFTER A MEAL TO DECREASE THE CHANCE OF STOMACH UPSET.  What is this medicine? COBICISTAT; ELVITEGRAVIR; EMTRICITABINE; TENOFOVIR ALAFENAMIDE (koe BIS i stat; el vye TEG ra veer; em tri SIT uh bean; te NOE fo veer) is three antiretroviral medicines and a medication booster in one tablet. It is used to treat HIV. This medicine is not a cure for HIV. It will not stop the spread of HIV to others. This medicine may be used for other purposes; ask your health care provider or pharmacist if you have questions. COMMON BRAND NAME(S): Genvoya  What should I tell my health care provider before I take this medicine? They need to know if you have any of these conditions: -kidney disease -liver disease -an unusual or allergic reaction to cobicistat, elvitegravir, emtricitabine, tenofovir, other medicines, foods, dyes, or preservatives -pregnant or trying to get pregnant -breast-feeding  How should I use this medicine? Take this medicine by mouth with a glass of water. Follow the directions on the prescription label. Take this  medicine with food. Take your medicine at regular intervals. Do not take your medicine more often than directed. For your anti-HIV therapy to work as well as possible, take each dose exactly as prescribed. Do not skip doses or stop your medicine even if you feel better. Skipping doses may make the HIV virus resistant to this  medicine and other medicines. Do not stop taking except on your doctor's advice. Talk to your pediatrician regarding the use of this medicine in children. While this drug may be prescribed for selected conditions, precautions do apply. Overdosage: If you think you have taken too much of this medicine contact a poison control center or emergency room at once. NOTE: This medicine is only for you. Do not share this medicine with others.  What if I miss a dose? If you miss a dose, take it as soon as you can. If it is almost time for your next dose, take only that dose. Do not take double or extra doses.  What may interact with this medicine? Do not take this medicine with any of the following medications: -adefovir -alfuzosin -certain medicines for seizures like carbamazepine, phenobarbital, phenytoin -cisapride -lumacaftor; ivacaftor -lurasidone -medicines for cholesterol like lovastatin, simvastatin -medicines for headaches like dihydroergotamine, ergotamine, methylergonovine -midazolam -other antiviral medicines for HIV or AIDS -pimozide -rifampin -sildenafil -St. John's wort -triazolam This medicine may also interact with the following medications: -antacids -atorvastatin -bosentan -buprenorphine; naloxone -certain antibiotics like clarithromycin, telithromycin, rifabutin, rifapentine -certain medications for anxiety or sleep like buspirone, clorazepate, diazepam, estazolam, flurazepam, zolpidem -certain medicines for blood pressure or heart disease like amlodipine, diltiazem, felodipine, metoprolol, nicardipine, nifedipine, timolol, verapamil -certain medicines  for depression, anxiety, or psychiatric disturbances -certain medicines for erectile dysfunction like avanafil, sildenafil, tadalafil, vardenafil -certain medicines for fungal infection like itraconazole, ketoconazole, voriconazole -colchicine -cyclosporine -dexamethasone -female hormones, like estrogens and progestins and birth control pills -fluticasone -medicines for infection like acyclovir, cidofovir, valacyclovir, ganciclovir, valganciclovir -medicines for irregular heart beat like amiodarone, bepridil, digoxin, disopyramide, dofetilide, flecainide, lidocaine, mexiletine, propafenone, quinidine -metformin -oxcarbazepine -phenothiazines like perphenazine, risperidone, thioridazine -salmeterol -sirolimus -tacrolimus -warfarin This list may not describe all possible interactions. Give your health care provider a list of all the medicines, herbs, non-prescription drugs, or dietary supplements you use. Also tell them if you smoke, drink alcohol, or use illegal drugs. Some items may interact with your medicine.  What should I watch for while using this medicine? Visit your doctor or health care professional for regular check ups. Discuss any new symptoms with your doctor. You will need to have important blood work done while on this medicine. HIV is spread to others through sexual or blood contact. Talk to your doctor about how to stop the spread of HIV. If you have hepatitis B, talk to your doctor if you plan to stop this medicine. The symptoms of hepatitis B may get worse if you stop this medicine. Birth control pills may not work properly while you are taking this medicine. Talk to your doctor about using an extra method of birth control. Women who can still have children must use a reliable form of barrier contraception, like a condom.  What side effects may I notice from receiving this medicine? Side effects that you should report to your doctor or health care professional as soon as  possible: -allergic reactions like skin rash, itching or hives, swelling of the face, lips, or tongue -breathing problems -fast, irregular heartbeat -muscle pain or weakness -signs and symptoms of kidney injury like trouble passing urine or change in the amount of urine -signs and symptoms of liver injury like dark yellow or brown urine; general ill feeling or flu-like symptoms; light-colored stools; loss of appetite; right upper belly pain; unusually weak or tired; yellowing of the eyes or skin Side effects that usually do not require medical attention (report to your  doctor or health care professional if they continue or are bothersome): -diarrhea -headache -nausea -tiredness This list may not describe all possible side effects. Call your doctor for medical advice about side effects. You may report side effects to FDA at 1-800-FDA-1088.  Where should I keep my medicine? Keep out of the reach of children. Store at room temperature below 30 degrees C (86 degrees F). Throw away any unused medicine after the expiration date. NOTE: This sheet is a summary. It may not cover all possible information. If you have questions about this medicine, talk to your doctor, pharmacist, or health care provider.  2018 Elsevier/Gold Standard (2016-03-12 12:54:04)

## 2018-11-07 NOTE — SANE Note (Signed)
Follow-up Phone Call  Patient gives verbal consent for a FNE/SANE follow-up phone call in 48-72 hours: DID NOT ASK THE PT. Patient's telephone number: [phone] (PT'S CELL PHONE W/ VM & TEXTING) Patient gives verbal consent to leave voicemail at the phone number listed above: DID NOT ASK THE PT. 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  THE PT COULD NOT PROVIDE A RELIABLE MAILING/LIVING ADDRESS  30-DAY SUPPLY OF GENVOYA WAS OBTAINED FROM THE [facility] AND WAS GIVEN TO THE PT TO TAKE HOME.  1 TAB WAS GIVEN IN THE ED (THE ED RN WAS INSTRUCTED THAT IF THE PT COULD KEEP THAT 1 TAB DOWN THEN TO SEND THE REMAINING 29-DAY SUPPLY OF GENVOYA HOME WITH PT, ALONG WITH HER OTHER STI MEDS AND EMERGENCY CONTRACEPTION).  [place] [employer] CASE NUMBER:  [id number]  OFFICER:  B. SEPULVEDA # [id number]

## 2018-11-07 NOTE — ED Notes (Addendum)
Pt updated on plan of care and arrival time of SANE nurse.

## 2018-11-07 NOTE — SANE Note (Signed)
On 11/07/2018 hours, at approximately 1530 hours, the SANE/FNE Teacher, music) consult was completed. The primary and charge RN have been notified. Please contact the SANE/FNE nurse on call (listed in Amion) with any further concerns.

## 2018-11-07 NOTE — ED Notes (Signed)
SANE nurse Lillia Abed arrives.

## 2018-11-08 LAB — HEPATITIS B SURFACE ANTIGEN: Hepatitis B Surface Ag: NEGATIVE

## 2018-11-08 LAB — HEPATITIS C ANTIBODY: HCV Ab: 0.1 s/co ratio (ref 0.0–0.9)

## 2018-11-10 ENCOUNTER — Telehealth: Payer: Self-pay | Admitting: Obstetrics & Gynecology

## 2018-11-10 LAB — RPR: RPR Ser Ql: NONREACTIVE

## 2018-11-10 NOTE — Telephone Encounter (Signed)
Attempted to reach patient with number given by Gerhard Perches, RN. Was not able to leave a message. Said number not available at this time.

## 2018-11-19 ENCOUNTER — Telehealth: Payer: Self-pay | Admitting: Family Medicine

## 2018-11-19 ENCOUNTER — Telehealth: Payer: Self-pay | Admitting: Obstetrics & Gynecology

## 2018-11-19 NOTE — Telephone Encounter (Signed)
Attempted to call patient to ask her about any symptoms, and to wear her mask the whole visit covering her nose and mouth. Also, to sanitize her hands upon arriving. Was not able to leave a VM message.

## 2018-11-19 NOTE — Telephone Encounter (Signed)
Tried calling patient about upcoming appt, was unable to leave a message

## 2018-11-21 ENCOUNTER — Encounter: Payer: Self-pay | Admitting: Obstetrics & Gynecology
